# Patient Record
Sex: Male | Born: 1950 | Race: White | Hispanic: No | Marital: Married | Smoking: Former smoker
Health system: Southern US, Community
[De-identification: ages and names within clinical notes are randomized; demographics above are authoritative.]

## PROBLEM LIST (undated history)

## (undated) DIAGNOSIS — E7800 Pure hypercholesterolemia, unspecified: Secondary | ICD-10-CM

## (undated) DIAGNOSIS — M109 Gout, unspecified: Secondary | ICD-10-CM

## (undated) DIAGNOSIS — I10 Essential (primary) hypertension: Secondary | ICD-10-CM

## (undated) DIAGNOSIS — I2510 Atherosclerotic heart disease of native coronary artery without angina pectoris: Secondary | ICD-10-CM

## (undated) HISTORY — PX: CORONARY ARTERY BYPASS GRAFT: SHX141

## (undated) HISTORY — PX: CARDIAC SURGERY: SHX584

---

## 1998-02-18 ENCOUNTER — Ambulatory Visit (HOSPITAL_COMMUNITY): Admission: RE | Admit: 1998-02-18 | Discharge: 1998-02-18 | Payer: Self-pay | Admitting: Internal Medicine

## 1998-03-05 ENCOUNTER — Encounter: Payer: Self-pay | Admitting: Neurosurgery

## 1998-03-07 ENCOUNTER — Encounter: Payer: Self-pay | Admitting: Neurosurgery

## 1998-03-07 ENCOUNTER — Inpatient Hospital Stay (HOSPITAL_COMMUNITY): Admission: RE | Admit: 1998-03-07 | Discharge: 1998-03-08 | Payer: Self-pay | Admitting: Neurosurgery

## 2001-03-26 ENCOUNTER — Emergency Department (HOSPITAL_COMMUNITY): Admission: EM | Admit: 2001-03-26 | Discharge: 2001-03-26 | Payer: Self-pay | Admitting: Emergency Medicine

## 2001-10-20 ENCOUNTER — Emergency Department (HOSPITAL_COMMUNITY): Admission: EM | Admit: 2001-10-20 | Discharge: 2001-10-20 | Payer: Self-pay

## 2002-01-05 ENCOUNTER — Ambulatory Visit: Admission: RE | Admit: 2002-01-05 | Discharge: 2002-01-05 | Payer: Self-pay | Admitting: Internal Medicine

## 2002-09-13 ENCOUNTER — Ambulatory Visit (HOSPITAL_COMMUNITY): Admission: RE | Admit: 2002-09-13 | Discharge: 2002-09-13 | Payer: Self-pay | Admitting: Internal Medicine

## 2002-09-13 ENCOUNTER — Encounter: Payer: Self-pay | Admitting: Internal Medicine

## 2002-11-28 ENCOUNTER — Emergency Department (HOSPITAL_COMMUNITY): Admission: EM | Admit: 2002-11-28 | Discharge: 2002-11-28 | Payer: Self-pay | Admitting: Emergency Medicine

## 2003-01-25 ENCOUNTER — Ambulatory Visit (HOSPITAL_COMMUNITY): Admission: RE | Admit: 2003-01-25 | Discharge: 2003-01-25 | Payer: Self-pay | Admitting: Internal Medicine

## 2003-01-25 ENCOUNTER — Encounter: Payer: Self-pay | Admitting: Internal Medicine

## 2003-01-29 ENCOUNTER — Encounter: Payer: Self-pay | Admitting: Internal Medicine

## 2003-01-29 ENCOUNTER — Ambulatory Visit (HOSPITAL_COMMUNITY): Admission: RE | Admit: 2003-01-29 | Discharge: 2003-01-29 | Payer: Self-pay | Admitting: Internal Medicine

## 2004-01-01 ENCOUNTER — Encounter (HOSPITAL_COMMUNITY): Admission: RE | Admit: 2004-01-01 | Discharge: 2004-03-31 | Payer: Self-pay | Admitting: Internal Medicine

## 2004-06-29 ENCOUNTER — Ambulatory Visit: Payer: Self-pay | Admitting: Internal Medicine

## 2004-07-02 ENCOUNTER — Ambulatory Visit: Payer: Self-pay | Admitting: Internal Medicine

## 2004-07-14 ENCOUNTER — Ambulatory Visit: Payer: Self-pay | Admitting: Internal Medicine

## 2004-07-28 ENCOUNTER — Ambulatory Visit: Payer: Self-pay | Admitting: Internal Medicine

## 2004-07-28 ENCOUNTER — Ambulatory Visit (HOSPITAL_COMMUNITY): Admission: RE | Admit: 2004-07-28 | Discharge: 2004-07-28 | Payer: Self-pay | Admitting: Internal Medicine

## 2007-10-03 ENCOUNTER — Encounter: Admission: RE | Admit: 2007-10-03 | Discharge: 2007-10-03 | Payer: Self-pay | Admitting: Internal Medicine

## 2008-07-10 ENCOUNTER — Ambulatory Visit (HOSPITAL_COMMUNITY): Admission: RE | Admit: 2008-07-10 | Discharge: 2008-07-10 | Payer: Self-pay | Admitting: Internal Medicine

## 2008-07-15 ENCOUNTER — Encounter: Admission: RE | Admit: 2008-07-15 | Discharge: 2008-07-15 | Payer: Self-pay | Admitting: Internal Medicine

## 2009-06-23 ENCOUNTER — Encounter (INDEPENDENT_AMBULATORY_CARE_PROVIDER_SITE_OTHER): Payer: Self-pay | Admitting: *Deleted

## 2009-12-08 ENCOUNTER — Ambulatory Visit (HOSPITAL_COMMUNITY): Admission: RE | Admit: 2009-12-08 | Discharge: 2009-12-08 | Payer: Self-pay | Admitting: Internal Medicine

## 2010-01-09 ENCOUNTER — Telehealth: Payer: Self-pay | Admitting: Internal Medicine

## 2010-07-05 ENCOUNTER — Encounter: Payer: Self-pay | Admitting: Internal Medicine

## 2010-07-14 NOTE — Progress Notes (Signed)
Summary: Schedule Colonoscopy  Phone Note Outgoing Call Call back at Home Phone [phone]   Call placed by: Harlow Mares CMA Duncan Dull),  January 09, 2010 2:50 PM Call placed to: Patient Summary of Call: patient states that he is getting ready to have back surgery and he wanted the number to call back when he finds out if he surgery is going to be scheduled soon. I gave him the number and Dr. Broadus Dorsie Burich name.  Initial call taken by: Harlow Mares CMA Duncan Dull),  January 09, 2010 2:52 PM

## 2010-07-14 NOTE — Letter (Signed)
Summary: Colonoscopy Letter  [facility]  [address] [place], [place] [address]   Phone: [phone]  Fax: [phone]      June 23, 2009 MRN: [id number]   CHRISTINA WALDROP [address] [place], [place]  [address]   Dear Mr. Wirtanen,   According to your medical record, it is time for you to schedule a Colonoscopy. The American Cancer Society recommends this procedure as a method to detect early colon cancer. Patients with a family history of colon cancer, or a personal history of colon polyps or inflammatory bowel disease are at increased risk.  This letter has beeen generated based on the recommendations made at the time of your procedure. If you feel that in your particular situation this may no longer apply, please contact our office.  Please call our office at [phone] to schedule this appointment or to update your records at your earliest convenience.  Thank you for cooperating with [place] to provide you with the very best care possible.   Sincerely,  Wilhemina Bonito. Marina Goodell, M.D.  [facility] [phone]

## 2014-08-19 DIAGNOSIS — M792 Neuralgia and neuritis, unspecified: Secondary | ICD-10-CM | POA: Diagnosis not present

## 2014-08-31 DIAGNOSIS — I10 Essential (primary) hypertension: Secondary | ICD-10-CM | POA: Diagnosis not present

## 2014-08-31 DIAGNOSIS — M5417 Radiculopathy, lumbosacral region: Secondary | ICD-10-CM | POA: Diagnosis not present

## 2014-08-31 DIAGNOSIS — Z87891 Personal history of nicotine dependence: Secondary | ICD-10-CM | POA: Diagnosis not present

## 2015-06-04 ENCOUNTER — Encounter (HOSPITAL_COMMUNITY): Payer: Self-pay | Admitting: *Deleted

## 2015-06-04 ENCOUNTER — Emergency Department (HOSPITAL_COMMUNITY): Payer: Medicare Other

## 2015-06-04 ENCOUNTER — Inpatient Hospital Stay (HOSPITAL_COMMUNITY)
Admission: EM | Admit: 2015-06-04 | Discharge: 2015-06-07 | DRG: 872 | Disposition: A | Discharge status: Home / Self Care | Payer: Medicare Other | Attending: Internal Medicine | Admitting: Internal Medicine

## 2015-06-04 DIAGNOSIS — F1290 Cannabis use, unspecified, uncomplicated: Secondary | ICD-10-CM | POA: Diagnosis present

## 2015-06-04 DIAGNOSIS — A4151 Sepsis due to Escherichia coli [E. coli]: Secondary | ICD-10-CM | POA: Diagnosis not present

## 2015-06-04 DIAGNOSIS — E876 Hypokalemia: Secondary | ICD-10-CM | POA: Diagnosis present

## 2015-06-04 DIAGNOSIS — Z8249 Family history of ischemic heart disease and other diseases of the circulatory system: Secondary | ICD-10-CM

## 2015-06-04 DIAGNOSIS — I2510 Atherosclerotic heart disease of native coronary artery without angina pectoris: Secondary | ICD-10-CM | POA: Diagnosis not present

## 2015-06-04 DIAGNOSIS — Z87891 Personal history of nicotine dependence: Secondary | ICD-10-CM

## 2015-06-04 DIAGNOSIS — I10 Essential (primary) hypertension: Secondary | ICD-10-CM | POA: Diagnosis present

## 2015-06-04 DIAGNOSIS — N390 Urinary tract infection, site not specified: Secondary | ICD-10-CM | POA: Diagnosis present

## 2015-06-04 DIAGNOSIS — R079 Chest pain, unspecified: Secondary | ICD-10-CM

## 2015-06-04 DIAGNOSIS — D6959 Other secondary thrombocytopenia: Secondary | ICD-10-CM | POA: Diagnosis present

## 2015-06-04 DIAGNOSIS — E7800 Pure hypercholesterolemia, unspecified: Secondary | ICD-10-CM | POA: Diagnosis present

## 2015-06-04 DIAGNOSIS — R109 Unspecified abdominal pain: Secondary | ICD-10-CM

## 2015-06-04 DIAGNOSIS — E785 Hyperlipidemia, unspecified: Secondary | ICD-10-CM | POA: Diagnosis present

## 2015-06-04 DIAGNOSIS — Z951 Presence of aortocoronary bypass graft: Secondary | ICD-10-CM

## 2015-06-04 DIAGNOSIS — R1110 Vomiting, unspecified: Secondary | ICD-10-CM | POA: Diagnosis not present

## 2015-06-04 DIAGNOSIS — A419 Sepsis, unspecified organism: Secondary | ICD-10-CM | POA: Diagnosis present

## 2015-06-04 DIAGNOSIS — Z8049 Family history of malignant neoplasm of other genital organs: Secondary | ICD-10-CM

## 2015-06-04 DIAGNOSIS — R9431 Abnormal electrocardiogram [ECG] [EKG]: Secondary | ICD-10-CM | POA: Diagnosis not present

## 2015-06-04 DIAGNOSIS — N50812 Left testicular pain: Secondary | ICD-10-CM | POA: Insufficient documentation

## 2015-06-04 DIAGNOSIS — N419 Inflammatory disease of prostate, unspecified: Secondary | ICD-10-CM | POA: Diagnosis present

## 2015-06-04 DIAGNOSIS — R161 Splenomegaly, not elsewhere classified: Secondary | ICD-10-CM | POA: Diagnosis present

## 2015-06-04 DIAGNOSIS — N451 Epididymitis: Secondary | ICD-10-CM | POA: Diagnosis present

## 2015-06-04 DIAGNOSIS — R05 Cough: Secondary | ICD-10-CM | POA: Diagnosis present

## 2015-06-04 DIAGNOSIS — F329 Major depressive disorder, single episode, unspecified: Secondary | ICD-10-CM | POA: Diagnosis present

## 2015-06-04 HISTORY — DX: Pure hypercholesterolemia, unspecified: E78.00

## 2015-06-04 HISTORY — DX: Atherosclerotic heart disease of native coronary artery without angina pectoris: I25.10

## 2015-06-04 HISTORY — DX: Essential (primary) hypertension: I10

## 2015-06-04 HISTORY — DX: Gout, unspecified: M10.9

## 2015-06-04 LAB — URINALYSIS, ROUTINE W REFLEX MICROSCOPIC
Glucose, UA: NEGATIVE mg/dL
KETONES UR: 15 mg/dL — AB
NITRITE: POSITIVE — AB
Protein, ur: 30 mg/dL — AB
SPECIFIC GRAVITY, URINE: 1.02 (ref 1.005–1.030)
pH: 5.5 (ref 5.0–8.0)

## 2015-06-04 LAB — CBC
HCT: 38.8 % — ABNORMAL LOW (ref 39.0–52.0)
HEMATOCRIT: 41.2 % (ref 39.0–52.0)
Hemoglobin: 14.6 g/dL (ref 13.0–17.0)
Hemoglobin: 13.3 g/dL (ref 13.0–17.0)
MCH: 31.8 pg (ref 26.0–34.0)
MCH: 31.7 pg (ref 26.0–34.0)
MCHC: 35.4 g/dL (ref 30.0–36.0)
MCHC: 34.3 g/dL (ref 30.0–36.0)
MCV: 89.8 fL (ref 78.0–100.0)
MCV: 92.4 fL (ref 78.0–100.0)
PLATELETS: 98 10*3/uL — AB (ref 150–400)
Platelets: 109 10*3/uL — ABNORMAL LOW (ref 150–400)
RBC: 4.59 MIL/uL (ref 4.22–5.81)
RBC: 4.2 MIL/uL — AB (ref 4.22–5.81)
RDW: 12.9 % (ref 11.5–15.5)
RDW: 13 % (ref 11.5–15.5)
WBC: 14.5 10*3/uL — ABNORMAL HIGH (ref 4.0–10.5)
WBC: 11.3 10*3/uL — AB (ref 4.0–10.5)

## 2015-06-04 LAB — URINE MICROSCOPIC-ADD ON

## 2015-06-04 LAB — BASIC METABOLIC PANEL
Anion gap: 12 (ref 5–15)
BUN: 11 mg/dL (ref 6–20)
CHLORIDE: 100 mmol/L — AB (ref 101–111)
CO2: 23 mmol/L (ref 22–32)
Calcium: 9 mg/dL (ref 8.9–10.3)
Creatinine, Ser: 1.03 mg/dL (ref 0.61–1.24)
GFR calc Af Amer: >60 mL/min (ref >60)
GFR calc non Af Amer: >60 mL/min (ref >60)
GLUCOSE: 128 mg/dL — AB (ref 65–99)
POTASSIUM: 3.5 mmol/L (ref 3.5–5.1)
Sodium: 135 mmol/L (ref 135–145)

## 2015-06-04 LAB — I-STAT TROPONIN, ED: Troponin i, poc: 0.02 ng/mL (ref 0.00–0.08)

## 2015-06-04 LAB — I-STAT CG4 LACTIC ACID, ED: Lactic Acid, Venous: 1.04 mmol/L (ref 0.5–2.0)

## 2015-06-04 MED ORDER — PANTOPRAZOLE SODIUM 40 MG PO TBEC
40.0000 mg | DELAYED_RELEASE_TABLET | Freq: Every day | ORAL | Status: DC
  Administered 2015-06-05 – 2015-06-07 (×3): 40 mg via ORAL
  Filled 2015-06-04 (×3): qty 1

## 2015-06-04 MED ORDER — ENOXAPARIN SODIUM 40 MG/0.4ML ~~LOC~~ SOLN
40.0000 mg | SUBCUTANEOUS | Status: DC
  Administered 2015-06-05 – 2015-06-07 (×3): 40 mg via SUBCUTANEOUS
  Filled 2015-06-04 (×3): qty 0.4

## 2015-06-04 MED ORDER — SODIUM CHLORIDE 0.9 % IV SOLN
INTRAVENOUS | Status: AC
  Administered 2015-06-04: 23:00:00 via INTRAVENOUS

## 2015-06-04 MED ORDER — ONDANSETRON HCL 4 MG/2ML IJ SOLN
4.0000 mg | Freq: Four times a day (QID) | INTRAMUSCULAR | Status: DC | PRN
Start: 2015-06-04 — End: 2015-06-07
  Administered 2015-06-06: 4 mg via INTRAVENOUS
  Filled 2015-06-04: qty 2

## 2015-06-04 MED ORDER — SERTRALINE HCL 50 MG PO TABS
150.0000 mg | ORAL_TABLET | Freq: Every day | ORAL | Status: DC
  Administered 2015-06-05 – 2015-06-07 (×3): 150 mg via ORAL
  Filled 2015-06-04 (×3): qty 1

## 2015-06-04 MED ORDER — HYDROCODONE-ACETAMINOPHEN 5-325 MG PO TABS
1.0000 | ORAL_TABLET | ORAL | Status: DC | PRN
  Administered 2015-06-04 – 2015-06-06 (×6): 2 via ORAL
  Filled 2015-06-04 (×6): qty 2

## 2015-06-04 MED ORDER — ATORVASTATIN CALCIUM 10 MG PO TABS
10.0000 mg | ORAL_TABLET | Freq: Every day | ORAL | Status: DC
  Administered 2015-06-05 – 2015-06-07 (×3): 10 mg via ORAL
  Filled 2015-06-04 (×3): qty 1

## 2015-06-04 MED ORDER — ACETAMINOPHEN 325 MG PO TABS
650.0000 mg | ORAL_TABLET | Freq: Once | ORAL | Status: AC
  Administered 2015-06-04: 650 mg via ORAL
  Filled 2015-06-04: qty 2

## 2015-06-04 MED ORDER — DEXTROSE 5 % IV SOLN
1.0000 g | INTRAVENOUS | Status: DC
  Administered 2015-06-04 – 2015-06-05 (×2): 1 g via INTRAVENOUS
  Filled 2015-06-04 (×3): qty 10

## 2015-06-04 MED ORDER — ALLOPURINOL 300 MG PO TABS
150.0000 mg | ORAL_TABLET | Freq: Every day | ORAL | Status: DC
  Administered 2015-06-05 – 2015-06-07 (×3): 150 mg via ORAL
  Filled 2015-06-04 (×3): qty 1

## 2015-06-04 MED ORDER — SODIUM CHLORIDE 0.9 % IJ SOLN
3.0000 mL | Freq: Two times a day (BID) | INTRAMUSCULAR | Status: DC
  Administered 2015-06-04 – 2015-06-07 (×5): 3 mL via INTRAVENOUS

## 2015-06-04 MED ORDER — ACETAMINOPHEN 325 MG PO TABS
650.0000 mg | ORAL_TABLET | Freq: Four times a day (QID) | ORAL | Status: DC | PRN
  Administered 2015-06-05 – 2015-06-06 (×2): 650 mg via ORAL
  Filled 2015-06-04 (×3): qty 2

## 2015-06-04 MED ORDER — SODIUM CHLORIDE 0.9 % IV BOLUS (SEPSIS)
1000.0000 mL | INTRAVENOUS | Status: AC
  Administered 2015-06-04 – 2015-06-05 (×3): 1000 mL via INTRAVENOUS

## 2015-06-04 MED ORDER — ASPIRIN EC 81 MG PO TBEC
81.0000 mg | DELAYED_RELEASE_TABLET | Freq: Every day | ORAL | Status: DC
  Administered 2015-06-05 – 2015-06-07 (×3): 81 mg via ORAL
  Filled 2015-06-04 (×3): qty 1

## 2015-06-04 MED ORDER — ACETAMINOPHEN 650 MG RE SUPP
650.0000 mg | Freq: Four times a day (QID) | RECTAL | Status: DC | PRN
Start: 2015-06-04 — End: 2015-06-07

## 2015-06-04 MED ORDER — GUAIFENESIN ER 600 MG PO TB12
600.0000 mg | ORAL_TABLET | Freq: Two times a day (BID) | ORAL | Status: DC
  Administered 2015-06-04 – 2015-06-07 (×6): 600 mg via ORAL
  Filled 2015-06-04 (×6): qty 1

## 2015-06-04 MED ORDER — ALBUTEROL SULFATE (2.5 MG/3ML) 0.083% IN NEBU: 2.5000 mg | INHALATION_SOLUTION | RESPIRATORY_TRACT | Status: DC | PRN

## 2015-06-04 MED ORDER — GABAPENTIN 300 MG PO CAPS
300.0000 mg | ORAL_CAPSULE | Freq: Three times a day (TID) | ORAL | Status: DC
  Administered 2015-06-04 – 2015-06-07 (×8): 300 mg via ORAL
  Filled 2015-06-04 (×8): qty 1

## 2015-06-04 MED ORDER — METOPROLOL TARTRATE 12.5 MG HALF TABLET
12.5000 mg | ORAL_TABLET | Freq: Two times a day (BID) | ORAL | Status: DC
  Administered 2015-06-05 – 2015-06-07 (×5): 12.5 mg via ORAL
  Filled 2015-06-04 (×6): qty 1

## 2015-06-04 MED ORDER — ONDANSETRON HCL 4 MG PO TABS: 4.0000 mg | ORAL_TABLET | Freq: Four times a day (QID) | ORAL | Status: DC | PRN

## 2015-06-04 NOTE — ED Notes (Addendum)
Pt reports right side chest pains for several days. Has chronic sob, reports dry cough x 1 month. Denies swelling to extremities but is having testicle pain and swelling.also reports fever, back pain, hallucinations, fatigue and vomiting.

## 2015-06-04 NOTE — ED Notes (Signed)
Pt transported to xray 

## 2015-06-04 NOTE — H&P (Signed)
PCP: [facility] at [facility] in [place] [facility] Marina Goodell Referring provider Freida Busman   Chief Complaint:  Right side chest pain and dry cough, testicular pain and swelling fever and back pain  HPI: Gregory Juarez is a 64 y.o. male   has a past medical history of Hypertension; High cholesterol; and Coronary artery disease.   Presented with some cough for more than 1 month and congestion He is reporting some green sputum production.. Recently 2 days ago  He developed fever at home as high as 102. Reports some dysuria and suprapubic pressure for the past few days. Reports Vomiting but no diarrhea. He reports two episodes of stubbing right chest pain now has resolved. 2 weeks ago he had an episode of syncope when he became significantly hypertensive. Patient is sp CABG in [facility] He has been a bit a bit confused over the past 1 month forgetting things and reports hallucinations. Seeing his grandson today when he was not there. At that time he had fever.  He sometimes sees things out the corner of his eyes that has been there for years.  UA was worrisome for UTI per ER provider perineal exam showed tenderness worrisome for prostatitis Chest x-ray showed no evidence of infiltrate. Patient was started on Rocephin Hospitalist was called for admission for UTI with possible prostatitis  Review of Systems:    Pertinent positives include:  Fevers, chills, fatigue, nausea, vomiting, dysuria, change  color of urine, hesitance straining to urinate.  Constitutional:  No weight loss, night sweats, weight loss  HEENT:  No headaches, Difficulty swallowing,Tooth/dental problems,Sore throat,  No sneezing, itching, ear ache, nasal congestion, post nasal drip,  Cardio-vascular:  No chest pain, Orthopnea, PND, anasarca, dizziness, palpitations.no Bilateral lower extremity swelling  GI:  No heartburn, indigestion, abdominal pain, diarrhea, change in bowel habits, loss of appetite, melena, blood in stool,  hematemesis Resp:  no shortness of breath at rest. No dyspnea on exertion, No excess mucus, no productive cough, No non-productive cough, No coughing up of blood.No change in color of mucus.No wheezing. Skin:  no rash or lesions. No jaundice GU:    no urgency or frequency  No flank pain.  Musculoskeletal:  No joint pain or no joint swelling. No decreased range of motion. No back pain.  Psych:  No change in mood or affect. No depression or anxiety. No memory loss.  Neuro: no localizing neurological complaints, no tingling, no weakness, no double vision, no gait abnormality, no slurred speech, no confusion  Otherwise ROS are negative except for above, 10 systems were reviewed  Past Medical History: Past Medical History  Diagnosis Date  . Hypertension   . High cholesterol   . Coronary artery disease    Past Surgical History  Procedure Laterality Date  . Cardiac surgery       Medications: Prior to Admission medications   Medication Sig Start Date End Date Taking? Authorizing Provider  allopurinol (ZYLOPRIM) 300 MG tablet Take 150 mg by mouth daily.   Yes Historical Provider, MD  amLODipine (NORVASC) 10 MG tablet Take 10 mg by mouth daily.   Yes Historical Provider, MD  aspirin 81 MG tablet Take 81 mg by mouth daily.   Yes Historical Provider, MD  atorvastatin (LIPITOR) 20 MG tablet Take 10 mg by mouth daily.   Yes Historical Provider, MD  gabapentin (NEURONTIN) 300 MG capsule Take 300 mg by mouth 3 (three) times daily.   Yes Historical Provider, MD  hydrochlorothiazide (HYDRODIURIL) 25 MG tablet Take 12.5  mg by mouth daily.   Yes Historical Provider, MD  metoprolol (LOPRESSOR) 50 MG tablet Take 25 mg by mouth daily.   Yes Historical Provider, MD  minoxidil (LONITEN) 10 MG tablet Take 10 mg by mouth daily.   Yes Historical Provider, MD  omeprazole (PRILOSEC) 20 MG capsule Take 20 mg by mouth daily.   Yes Historical Provider, MD  PRESCRIPTION MEDICATION Place 1 drop into both eyes  4 (four) times daily. Medication: Refresh Classic eye drops   Yes Historical Provider, MD  sertraline (ZOLOFT) 100 MG tablet Take 150 mg by mouth daily. Splits the  tablet in half to make total of    Yes Historical Provider, MD    Allergies:  No Known Allergies  Social History:  Ambulatory independently  Lives at home   With family     reports that he has quit smoking. He does not have any smokeless tobacco history on file. He reports that he uses illicit drugs (Marijuana). He reports that he does not drink alcohol.    Family History: family history includes CAD in his sister; Uterine cancer in his mother.    Physical Exam: Patient Vitals for the past 24 hrs:  BP Temp Temp src Pulse Resp SpO2  06/04/15 1853 - 100.1 F (37.8 C) Oral - - -  06/04/15 1845 114/59 mmHg - - 85 21 94 %  06/04/15 1815 119/61 mmHg - - 94 17 97 %  06/04/15 1745 138/71 mmHg - - 93 20 97 %  06/04/15 1706 109/70 mmHg 100.6 F (38.1 C) Oral 97 18 98 %    1. General:  in No Acute distress 2. Psychological: Alert and  Oriented 3. Head/ENT:   Moist  Mucous Membranes                          Head Non traumatic, neck supple                          Norma  Dentition 4. SKIN decreased Skin turgor,  Skin clean Dry and intact no rash 5. Heart: Regular rate and rhythm no Murmur, Rub or gallop 6. Lungs: Clear to auscultation bilaterally, no wheezes or crackles   7. Abdomen: Soft, non-tender, Non distended 8. Lower extremities: no clubbing, cyanosis, or edema 9. Neurologically Grossly intact, moving all 4 extremities equally 10. MSK: Normal range of motion  body mass index is unknown because there is no height or weight on file.   Labs on Admission:   Results for orders placed or performed during the hospital encounter of 06/04/15 (from the past 24 hour(s))  I-stat troponin, ED (not at [facility])     Status: None   Collection Time: 06/04/15  5:22 PM  Result Value Ref Range   Troponin i, poc 0.02  0.00 - 0.08 ng/mL   Comment 3          Basic metabolic panel     Status: Abnormal   Collection Time: 06/04/15  5:27 PM  Result Value Ref Range   Sodium 135 135 - 145 mmol/L   Potassium 3.5 3.5 - 5.1 mmol/L   Chloride 100 (L) 101 - 111 mmol/L   CO2 23 22 - 32 mmol/L   Glucose, Bld 128 (H) 65 - 99 mg/dL   BUN 11 6 - 20 mg/dL   Creatinine, Ser 1.61 0.61 - 1.24 mg/dL   Calcium 9.0 8.9 - 09.6 mg/dL  GFR calc non Af Amer >60 >60 mL/min   GFR calc Af Amer >60 >60 mL/min   Anion gap 12 5 - 15  CBC     Status: Abnormal   Collection Time: 06/04/15  5:27 PM  Result Value Ref Range   WBC 14.5 (H) 4.0 - 10.5 K/uL   RBC 4.59 4.22 - 5.81 MIL/uL   Hemoglobin 14.6 13.0 - 17.0 g/dL   HCT 16.141.2 09.639.0 - 04.552.0 %   MCV 89.8 78.0 - 100.0 fL   MCH 31.8 26.0 - 34.0 pg   MCHC 35.4 30.0 - 36.0 g/dL   RDW 40.912.9 81.111.5 - 91.415.5 %   Platelets 109 (L) 150 - 400 K/uL  Urinalysis, Routine w reflex microscopic (not at [facility])     Status: Abnormal   Collection Time: 06/04/15  5:56 PM  Result Value Ref Range   Color, Urine AMBER (A) YELLOW   APPearance CLOUDY (A) CLEAR   Specific Gravity, Urine 1.020 1.005 - 1.030   pH 5.5 5.0 - 8.0   Glucose, UA NEGATIVE NEGATIVE mg/dL   Hgb urine dipstick SMALL (A) NEGATIVE   Bilirubin Urine SMALL (A) NEGATIVE   Ketones, ur 15 (A) NEGATIVE mg/dL   Protein, ur 30 (A) NEGATIVE mg/dL   Nitrite POSITIVE (A) NEGATIVE   Leukocytes, UA MODERATE (A) NEGATIVE  Urine microscopic-add on     Status: Abnormal   Collection Time: 06/04/15  5:56 PM  Result Value Ref Range   Squamous Epithelial / LPF 0-5 (A) NONE SEEN   WBC, UA TOO NUMEROUS TO COUNT 0 - 5 WBC/hpf   RBC / HPF 0-5 0 - 5 RBC/hpf   Bacteria, UA MANY (A) NONE SEEN   Urine-Other MUCOUS PRESENT   I-Stat CG4 Lactic Acid, ED     Status: None   Collection Time: 06/04/15  6:17 PM  Result Value Ref Range   Lactic Acid, Venous 1.04 0.5 - 2.0 mmol/L    UA evidence of UTi  No results found for: HGBA1C  CrCl cannot be calculated  (Unknown ideal weight.).  BNP (last 3 results) No results for input(s): PROBNP in the last 8760 hours.  Other results:  I have pearsonaly reviewed this: ECG REPORT  Rate: 97  Rhythm: Sinus rhythm with PACs ST&T Change: Nonspecific ST segment changes QTC within normal limits  There were no vitals filed for this visit.   Cultures: No results found for: SDES, SPECREQUEST, CULT, REPTSTATUS   Radiological Exams on Admission: Dg Chest 2 View  06/04/2015  CLINICAL DATA:  Left testicular swelling. Vomiting. Hallucinations. Chronic back pain. EXAM: CHEST  2 VIEW COMPARISON:  None. FINDINGS: There is no focal parenchymal opacity. There is no pleural effusion or pneumothorax. The heart and mediastinal contours are unremarkable. There is evidence of prior median sternotomy. The osseous structures are unremarkable. IMPRESSION: No active cardiopulmonary disease. Electronically Signed   By: Elige KoHetal  Patel   On: 06/04/2015 17:43    Chart has been reviewed  Family  at  Bedside  plan of care was discussed with   Daughter Sandrea HammondMylesha Blalock [phone]  Assessment/Plan  64 yo M with hx of CAD here with urinary complaints and evidence of UTI but possible prostatitis history of coughing negative chest x-ray most likely bronchitis  Present on Admission:  . Sepsis (HCC) solid evidence of end organ damage. We will rehydrate continue IV antibiotics await results of urine and blood cultures  . UTI (lower urinary tract infection) continue Rocephin await results of urine culture  .  Prostatitis treated with Rocephin  . Chest pain atypical but given history of coronary artery disease with cervical cardiac enzymes monitored telemetry  . CAD (coronary artery disease) continue aspirin and statin and beta blocker holding parameters  . Hypertension is soft blood pressures will hold off on Norvasc decrease dose of metoprolol with holding parameters, hold hydrochlorothiazide Persistent cough with past history of  smoking suspect bronchitis. Will go albuterol as needed and start Mucinex. Occasional confusion - hallucinations on associated high fever. Currently resolved. We will need to have this worked up as an outpatient. Will check B-12 level RPR and TSH  Prophylaxis:   Lovenox   CODE STATUS:  FULL CODE  as per patient   Disposition: To home once workup is complete and patient is stable  Other plan as per orders.  I have spent a total of  55 min on this admission  Brittanni Cariker 06/04/2015, 8:02 PM  [facility]  Pager [phone]   after 2 AM please page floor coverage PA If 7AM-7PM, please contact the day team taking care of the patient  [email or web address]  Password [email or web address]

## 2015-06-04 NOTE — ED Notes (Signed)
Report given.

## 2015-06-04 NOTE — ED Provider Notes (Signed)
CSN: [id number]     Arrival date & time 06/04/15  1659 History   First MD Initiated Contact with Patient 06/04/15 1726     Chief Complaint  Patient presents with  . Chest Pain  . Testicle Pain     (Consider location/radiation/quality/duration/timing/severity/associated sxs/prior Treatment) HPI Comments: Patient here complaining of one week of cough and congestion with associated temperature of 102 at home 2 days. Cough has been productive of green sputum. He also notes some suprapubic pressure as well as some dysuria. No reported flank pain. Did have emesis yesterday but none today. Denies any diarrhea. Denies any neck pain or photophobia. No recent rashes. Denies any chest discomfort. No testicular swelling. Has had some intermittent hallucinations. Symptoms persistent and no treatment use prior to arrival and nothing makes them better worse  Patient is a 64 y.o. male presenting with chest pain and testicular pain. The history is provided by the patient and a relative.  Chest Pain Testicle Pain Associated symptoms include chest pain.    Past Medical History  Diagnosis Date  . Hypertension   . High cholesterol   . Coronary artery disease    Past Surgical History  Procedure Laterality Date  . Cardiac surgery     History reviewed. No pertinent family history. Social History  Substance Use Topics  . Smoking status: Never Smoker   . Smokeless tobacco: None  . Alcohol Use: No    Review of Systems  Cardiovascular: Positive for chest pain.  Genitourinary: Positive for testicular pain.  All other systems reviewed and are negative.     Allergies  Review of patient's allergies indicates no known allergies.  Home Medications   Prior to Admission medications   Not on File   BP 109/70 mmHg  Pulse 97  Temp(Src) 100.6 F (38.1 C) (Oral)  Resp 18  SpO2 98% Physical Exam  Constitutional: He is oriented to person, place, and time. He appears well-developed and  well-nourished.  Non-toxic appearance. No distress.  HENT:  Head: Normocephalic and atraumatic.  Eyes: Conjunctivae, EOM and lids are normal. Pupils are equal, round, and reactive to light.  Neck: Normal range of motion. Neck supple. No tracheal deviation present. No thyroid mass present.  Cardiovascular: Normal rate, regular rhythm and normal heart sounds.  Exam reveals no gallop.   No murmur heard. Pulmonary/Chest: Effort normal and breath sounds normal. No stridor. No respiratory distress. He has no decreased breath sounds. He has no wheezes. He has no rhonchi. He has no rales.  Abdominal: Soft. Normal appearance and bowel sounds are normal. He exhibits no distension. There is no tenderness. There is no rebound and no CVA tenderness.  Musculoskeletal: Normal range of motion. He exhibits no edema or tenderness.  Neurological: He is alert and oriented to person, place, and time. He has normal strength. No cranial nerve deficit or sensory deficit. GCS eye subscore is 4. GCS verbal subscore is 5. GCS motor subscore is 6.  Skin: Skin is warm and dry. No abrasion and no rash noted.  Psychiatric: He has a normal mood and affect. His speech is normal and behavior is normal.  Nursing note and vitals reviewed.   ED Course  Procedures (including critical care time) Labs Review Labs Reviewed  CULTURE, BLOOD (ROUTINE X 2)  CULTURE, BLOOD (ROUTINE X 2)  URINE CULTURE  BASIC METABOLIC PANEL  CBC  URINALYSIS, ROUTINE W REFLEX MICROSCOPIC (NOT AT [facility])  Rosezena SensorI-STAT TROPOININ, ED  I-STAT CG4 LACTIC ACID, ED    Imaging  Review Dg Chest 2 View  06/04/2015  CLINICAL DATA:  Left testicular swelling. Vomiting. Hallucinations. Chronic back pain. EXAM: CHEST  2 VIEW COMPARISON:  None. FINDINGS: There is no focal parenchymal opacity. There is no pleural effusion or pneumothorax. The heart and mediastinal contours are unremarkable. There is evidence of prior median sternotomy. The osseous structures are  unremarkable. IMPRESSION: No active cardiopulmonary disease. Electronically Signed   By: Elige Ko   On: 06/04/2015 17:43   I have personally reviewed and evaluated these images and lab results as part of my medical decision-making.   EKG Interpretation   Date/Time:  Wednesday June 04 2015 17:00:31 EST Ventricular Rate:  97 PR Interval:  164 QRS Duration: 86 QT Interval:  336 QTC Calculation: 426 R Axis:   85 Text Interpretation:  Sinus rhythm with Premature atrial complexes  Nonspecific ST abnormality Abnormal ECG No significant change since last  tracing Confirmed by Aiya Keach  MD, Tonique Mendonca ([id number]) on 06/04/2015 5:50:54 PM      MDM   Final diagnoses:  None   Patient tended to palpation in his perineum and suspect he likely has prostatitis. He is feeling weak at this time and has been started on Rocephin and given IV fluids. Will be admitted to the medicine service     Lorre Nick, MD 06/04/15 [phone]

## 2015-06-05 ENCOUNTER — Inpatient Hospital Stay (HOSPITAL_COMMUNITY): Payer: Medicare Other

## 2015-06-05 DIAGNOSIS — F329 Major depressive disorder, single episode, unspecified: Secondary | ICD-10-CM | POA: Diagnosis present

## 2015-06-05 DIAGNOSIS — Z8049 Family history of malignant neoplasm of other genital organs: Secondary | ICD-10-CM | POA: Diagnosis not present

## 2015-06-05 DIAGNOSIS — R05 Cough: Secondary | ICD-10-CM | POA: Diagnosis present

## 2015-06-05 DIAGNOSIS — E7800 Pure hypercholesterolemia, unspecified: Secondary | ICD-10-CM | POA: Diagnosis present

## 2015-06-05 DIAGNOSIS — N50812 Left testicular pain: Secondary | ICD-10-CM | POA: Diagnosis not present

## 2015-06-05 DIAGNOSIS — R072 Precordial pain: Secondary | ICD-10-CM

## 2015-06-05 DIAGNOSIS — E785 Hyperlipidemia, unspecified: Secondary | ICD-10-CM | POA: Diagnosis present

## 2015-06-05 DIAGNOSIS — F1290 Cannabis use, unspecified, uncomplicated: Secondary | ICD-10-CM | POA: Diagnosis present

## 2015-06-05 DIAGNOSIS — N419 Inflammatory disease of prostate, unspecified: Secondary | ICD-10-CM | POA: Diagnosis not present

## 2015-06-05 DIAGNOSIS — R109 Unspecified abdominal pain: Secondary | ICD-10-CM | POA: Diagnosis not present

## 2015-06-05 DIAGNOSIS — D6959 Other secondary thrombocytopenia: Secondary | ICD-10-CM | POA: Diagnosis not present

## 2015-06-05 DIAGNOSIS — E876 Hypokalemia: Secondary | ICD-10-CM | POA: Diagnosis present

## 2015-06-05 DIAGNOSIS — A4151 Sepsis due to Escherichia coli [E. coli]: Secondary | ICD-10-CM | POA: Diagnosis not present

## 2015-06-05 DIAGNOSIS — N390 Urinary tract infection, site not specified: Secondary | ICD-10-CM | POA: Diagnosis not present

## 2015-06-05 DIAGNOSIS — Z87891 Personal history of nicotine dependence: Secondary | ICD-10-CM | POA: Diagnosis not present

## 2015-06-05 DIAGNOSIS — I2510 Atherosclerotic heart disease of native coronary artery without angina pectoris: Secondary | ICD-10-CM | POA: Diagnosis not present

## 2015-06-05 DIAGNOSIS — N410 Acute prostatitis: Secondary | ICD-10-CM | POA: Diagnosis not present

## 2015-06-05 DIAGNOSIS — A419 Sepsis, unspecified organism: Secondary | ICD-10-CM | POA: Diagnosis not present

## 2015-06-05 DIAGNOSIS — Z951 Presence of aortocoronary bypass graft: Secondary | ICD-10-CM | POA: Diagnosis not present

## 2015-06-05 DIAGNOSIS — I10 Essential (primary) hypertension: Secondary | ICD-10-CM | POA: Diagnosis not present

## 2015-06-05 DIAGNOSIS — R161 Splenomegaly, not elsewhere classified: Secondary | ICD-10-CM | POA: Diagnosis present

## 2015-06-05 DIAGNOSIS — N451 Epididymitis: Secondary | ICD-10-CM | POA: Diagnosis not present

## 2015-06-05 DIAGNOSIS — Z8249 Family history of ischemic heart disease and other diseases of the circulatory system: Secondary | ICD-10-CM | POA: Diagnosis not present

## 2015-06-05 LAB — COMPREHENSIVE METABOLIC PANEL
ALK PHOS: 94 U/L (ref 38–126)
ALT: 17 U/L (ref 17–63)
ANION GAP: 8 (ref 5–15)
AST: 19 U/L (ref 15–41)
Albumin: 2.9 g/dL — ABNORMAL LOW (ref 3.5–5.0)
BUN: 10 mg/dL (ref 6–20)
CALCIUM: 7.7 mg/dL — AB (ref 8.9–10.3)
CO2: 25 mmol/L (ref 22–32)
Chloride: 104 mmol/L (ref 101–111)
Creatinine, Ser: 0.85 mg/dL (ref 0.61–1.24)
GFR calc non Af Amer: >60 mL/min (ref >60)
Glucose, Bld: 132 mg/dL — ABNORMAL HIGH (ref 65–99)
Potassium: 3 mmol/L — ABNORMAL LOW (ref 3.5–5.1)
SODIUM: 137 mmol/L (ref 135–145)
TOTAL PROTEIN: 5.3 g/dL — AB (ref 6.5–8.1)
Total Bilirubin: 1.5 mg/dL — ABNORMAL HIGH (ref 0.3–1.2)

## 2015-06-05 LAB — CBC WITH DIFFERENTIAL/PLATELET
Basophils Absolute: 0 10*3/uL (ref 0.0–0.1)
Basophils Relative: 0 %
Eosinophils Absolute: 0 10*3/uL (ref 0.0–0.7)
Eosinophils Relative: 0 %
HEMATOCRIT: 33.8 % — AB (ref 39.0–52.0)
HEMOGLOBIN: 11.6 g/dL — AB (ref 13.0–17.0)
LYMPHS ABS: 0.6 10*3/uL — AB (ref 0.7–4.0)
LYMPHS PCT: 7 %
MCH: 31.4 pg (ref 26.0–34.0)
MCHC: 34.3 g/dL (ref 30.0–36.0)
MCV: 91.4 fL (ref 78.0–100.0)
Monocytes Absolute: 0.5 10*3/uL (ref 0.1–1.0)
Monocytes Relative: 6 %
NEUTROS PCT: 87 %
Neutro Abs: 7.2 10*3/uL (ref 1.7–7.7)
Platelets: 72 10*3/uL — ABNORMAL LOW (ref 150–400)
RBC: 3.7 MIL/uL — AB (ref 4.22–5.81)
RDW: 13 % (ref 11.5–15.5)
WBC: 8.3 10*3/uL (ref 4.0–10.5)

## 2015-06-05 LAB — MAGNESIUM: MAGNESIUM: 1.6 mg/dL — AB (ref 1.7–2.4)

## 2015-06-05 LAB — CREATININE, SERUM: Creatinine, Ser: 1.12 mg/dL (ref 0.61–1.24)

## 2015-06-05 LAB — TROPONIN I
Troponin I: 0.04 ng/mL — ABNORMAL HIGH (ref <0.031)
Troponin I: 0.04 ng/mL — ABNORMAL HIGH (ref <0.031)

## 2015-06-05 LAB — PROCALCITONIN: Procalcitonin: 0.28 ng/mL

## 2015-06-05 LAB — HIV ANTIBODY (ROUTINE TESTING W REFLEX): HIV Screen 4th Generation wRfx: NONREACTIVE

## 2015-06-05 LAB — TSH: TSH: 3.104 u[IU]/mL (ref 0.350–4.500)

## 2015-06-05 LAB — PHOSPHORUS: PHOSPHORUS: 2.2 mg/dL — AB (ref 2.5–4.6)

## 2015-06-05 LAB — RPR: RPR Ser Ql: NONREACTIVE

## 2015-06-05 MED ORDER — SODIUM CHLORIDE 0.9 % IV SOLN
INTRAVENOUS | Status: DC
  Administered 2015-06-05: 16:00:00 via INTRAVENOUS
  Filled 2015-06-05 (×2): qty 1000

## 2015-06-05 MED ORDER — POTASSIUM CHLORIDE IN NACL 20-0.9 MEQ/L-% IV SOLN
INTRAVENOUS | Status: DC
  Administered 2015-06-06: via INTRAVENOUS
  Filled 2015-06-05: qty 1000

## 2015-06-05 MED ORDER — POTASSIUM CHLORIDE CRYS ER 20 MEQ PO TBCR
40.0000 meq | EXTENDED_RELEASE_TABLET | Freq: Once | ORAL | Status: AC
  Administered 2015-06-05: 40 meq via ORAL
  Filled 2015-06-05: qty 2

## 2015-06-05 MED ORDER — ALUM & MAG HYDROXIDE-SIMETH 200-200-20 MG/5ML PO SUSP
30.0000 mL | Freq: Four times a day (QID) | ORAL | Status: DC | PRN
  Filled 2015-06-05: qty 30

## 2015-06-05 NOTE — Progress Notes (Addendum)
PROGRESS NOTE  Gregory Juarez QMV:[id number] DOB: 09/16/1950 DOA: 06/04/2015 PCP: PROVIDER NOT IN SYSTEM Brief History 64 year old male with a history of coronary artery disease status post CABG, hypertension, hyperlipidemia, depression presented with 2 day history of fever up to 102.75F with associated dysuria and left lower quadrant abdominal pain and left groin pain. The patient also has had left testicular swelling and pain for 2 days prior to admission with dysuria and hematuria. He denies any recent genitourinary manipulation or surgery. Patient had nausea and vomiting 2 days prior to admission, but this has improved. He denies any headache, chest pain, hemoptysis, hematochezia, melena, rashes, synovitis. He has been complaining of pleuritic right-sided chest pain for the past 2 years. He has had dyspnea on exertion after walking about 50 yards which has not changed. He denies any worsening lower extremity edema, increasing abdominal girth, or PND type symptoms. He has had a chronic cough for which she has had an extensive workup including chest x-rays and CTs of the chest which have been unremarkable. The patient takes a PPI at home without any change in his coughing.  Assessment/Plan: Sepsis -Suspect genitourinary source -UA shows TNTC WBC -Concern about orchitis/epididymitis -Continue intravenous fluids -Continue intravenous antibiotics pending culture data -Lactic acid 1.04 -Procalcitonin 0.28 Left lower quadrant abdominal pain/left testicular pain -Scrotal ultrasound -CT abdomen pelvis -Urine NAA for GC/Chlamydia Atypical chest pain -EKG without any concerning ischemic changes -Minimally elevated troponins likely represents demand ischemia -Continue aspirin Hypertension -Hold amlodipine, HCTZ -Continue low-dose metoprolol tartrate -Patient no longer takes minoxidil Coronary artery disease status post CABG -Continue aspirin and Lipitor -Continue metoprolol  tartrate Hypokalemia -Replete -Check magnesium  Family Communication:   Daughter updated at beside 12/22 Disposition Plan:   Home 1-2 days       Procedures/Studies: Dg Chest 2 View  06/04/2015  CLINICAL DATA:  Left testicular swelling. Vomiting. Hallucinations. Chronic back pain. EXAM: CHEST  2 VIEW COMPARISON:  None. FINDINGS: There is no focal parenchymal opacity. There is no pleural effusion or pneumothorax. The heart and mediastinal contours are unremarkable. There is evidence of prior median sternotomy. The osseous structures are unremarkable. IMPRESSION: No active cardiopulmonary disease. Electronically Signed   By: Elige KoHetal  Patel   On: 06/04/2015 17:43        Subjective: Patient complains of left lower quadrant and left inguinal pain as well as left scrotal pain. It is somewhat better than yesterday. Denies any fevers, chills, chest pain, hemoptysis, nausea, vomiting, diarrhea, hematochezia, melena  Objective: Filed Vitals:   06/05/15 0545 06/05/15 0654 06/05/15 0943 06/05/15 1058  BP: 103/58  115/67 109/60  Pulse: 79  90 80  Temp: 98.4 F (36.9 C)   98.5 F (36.9 C)  TempSrc: Oral   Oral  Resp: 18   18  Height:      Weight:  116 kg (255 lb 11.7 oz)    SpO2: 94%   97%    Intake/Output Summary (Last 24 hours) at 06/05/15 1442 Last data filed at 06/05/15 1058  Gross per 24 hour  Intake 1082.5 ml  Output   1125 ml  Net  -42.5 ml   Weight change:  Exam:   General:  Pt is alert, follows commands appropriately, not in acute distress  HEENT: No icterus, No thrush, No neck mass, [place]/AT  Cardiovascular: RRR, S1/S2, no rubs, no gallops  Respiratory: fine bibasilar crackles without any wheezing  Abdomen: Soft/+BS,  non distended, no guarding; LLQ  pain without rebound;     Extremities: No edema, No lymphangitis, No petechiae, No rashes, no synovitis  Genital--mild swelling of the left scrotum without any open wounds or drainage. No penile drainage.   Data  Reviewed: Basic Metabolic Panel:  Recent Labs Lab 06/04/15 1727 06/04/15 2257 06/05/15 0455  NA 135  --  137  K 3.5  --  3.0*  CL 100*  --  104  CO2 23  --  25  GLUCOSE 128*  --  132*  BUN 11  --  10  CREATININE 1.03 1.12 0.85  CALCIUM 9.0  --  7.7*  MG  --   --  1.6*  PHOS  --   --  2.2*   Liver Function Tests:  Recent Labs Lab 06/05/15 0455  AST 19  ALT 17  ALKPHOS 94  BILITOT 1.5*  PROT 5.3*  ALBUMIN 2.9*   No results for input(s): LIPASE, AMYLASE in the last 168 hours. No results for input(s): AMMONIA in the last 168 hours. CBC:  Recent Labs Lab 06/04/15 1727 06/04/15 2257 06/05/15 0455  WBC 14.5* 11.3* 8.3  NEUTROABS  --   --  7.2  HGB 14.6 13.3 11.6*  HCT 41.2 38.8* 33.8*  MCV 89.8 92.4 91.4  PLT 109* 98* 72*   Cardiac Enzymes:  Recent Labs Lab 06/04/15 2257 06/05/15 0455 06/05/15 0945  TROPONINI 0.04* 0.04* <0.03   BNP: Invalid input(s): POCBNP CBG: No results for input(s): GLUCAP in the last 168 hours.  Recent Results (from the past 240 hour(s))  Urine culture     Status: None (Preliminary result)   Collection Time: 06/04/15  5:56 PM  Result Value Ref Range Status   Specimen Description URINE, CLEAN CATCH  Final   Special Requests NONE  Final   Culture TOO YOUNG TO READ  Final   Report Status PENDING  Incomplete     Scheduled Meds: . allopurinol  150 mg Oral Daily  . aspirin EC  81 mg Oral Daily  . atorvastatin  10 mg Oral Daily  . cefTRIAXone (ROCEPHIN)  IV  1 g Intravenous Q24H  . enoxaparin (LOVENOX) injection  40 mg Subcutaneous Q24H  . gabapentin  300 mg Oral TID  . guaiFENesin  600 mg Oral BID  . metoprolol  12.5 mg Oral BID  . pantoprazole  40 mg Oral Daily  . sertraline  150 mg Oral Daily  . sodium chloride  3 mL Intravenous Q12H   Continuous Infusions:    Kelven Flater, DO  [facility] Pager [phone]  If 7PM-7AM, please contact night-coverage [email or web address] Password [email or web address] 06/05/2015, 2:42 PM   LOS: 0  days

## 2015-06-05 NOTE — Progress Notes (Signed)
Pt remains febrile after receiving both tylenol and then later Vicodin. MD on call text paged, will continue to monitor. Troy SineWalker, Maebry Obrien M

## 2015-06-06 ENCOUNTER — Encounter (HOSPITAL_COMMUNITY): Payer: Self-pay

## 2015-06-06 ENCOUNTER — Inpatient Hospital Stay (HOSPITAL_COMMUNITY): Payer: Medicare Other

## 2015-06-06 DIAGNOSIS — N451 Epididymitis: Secondary | ICD-10-CM

## 2015-06-06 DIAGNOSIS — A4151 Sepsis due to Escherichia coli [E. coli]: Principal | ICD-10-CM

## 2015-06-06 LAB — BASIC METABOLIC PANEL
Anion gap: 8 (ref 5–15)
BUN: 7 mg/dL (ref 6–20)
CHLORIDE: 104 mmol/L (ref 101–111)
CO2: 24 mmol/L (ref 22–32)
Calcium: 7.7 mg/dL — ABNORMAL LOW (ref 8.9–10.3)
Creatinine, Ser: 0.93 mg/dL (ref 0.61–1.24)
GFR calc Af Amer: >60 mL/min (ref >60)
GFR calc non Af Amer: >60 mL/min (ref >60)
Glucose, Bld: 159 mg/dL — ABNORMAL HIGH (ref 65–99)
POTASSIUM: 3.3 mmol/L — AB (ref 3.5–5.1)
SODIUM: 136 mmol/L (ref 135–145)

## 2015-06-06 LAB — CBC
HCT: 33.5 % — ABNORMAL LOW (ref 39.0–52.0)
Hemoglobin: 11.4 g/dL — ABNORMAL LOW (ref 13.0–17.0)
MCH: 31.7 pg (ref 26.0–34.0)
MCHC: 34 g/dL (ref 30.0–36.0)
MCV: 93.1 fL (ref 78.0–100.0)
PLATELETS: 69 10*3/uL — AB (ref 150–400)
RBC: 3.6 MIL/uL — ABNORMAL LOW (ref 4.22–5.81)
RDW: 13 % (ref 11.5–15.5)
WBC: 4.4 10*3/uL (ref 4.0–10.5)

## 2015-06-06 LAB — MAGNESIUM: MAGNESIUM: 1.7 mg/dL (ref 1.7–2.4)

## 2015-06-06 LAB — FOLATE RBC
FOLATE, HEMOLYSATE: 357.1 ng/mL
FOLATE, RBC: 1066 ng/mL (ref >498)
Hematocrit: 33.5 % — ABNORMAL LOW (ref 37.5–51.0)

## 2015-06-06 MED ORDER — HYDROCODONE-ACETAMINOPHEN 5-325 MG PO TABS: 1.0000 | ORAL_TABLET | ORAL | Status: AC | PRN

## 2015-06-06 MED ORDER — OXYMETAZOLINE HCL 0.05 % NA SOLN
1.0000 | Freq: Two times a day (BID) | NASAL | Status: DC | PRN
  Filled 2015-06-06: qty 15

## 2015-06-06 MED ORDER — POTASSIUM CHLORIDE CRYS ER 20 MEQ PO TBCR
20.0000 meq | EXTENDED_RELEASE_TABLET | Freq: Once | ORAL | Status: AC
  Administered 2015-06-06: 20 meq via ORAL
  Filled 2015-06-06: qty 1

## 2015-06-06 MED ORDER — LEVOFLOXACIN 750 MG PO TABS: 750.0000 mg | ORAL_TABLET | ORAL | Status: AC

## 2015-06-06 MED ORDER — OXYMETAZOLINE HCL 0.05 % NA SOLN
1.0000 | Freq: Two times a day (BID) | NASAL | Status: DC
  Filled 2015-06-06: qty 15

## 2015-06-06 MED ORDER — MAGNESIUM SULFATE 2 GM/50ML IV SOLN
2.0000 g | Freq: Once | INTRAVENOUS | Status: AC
  Administered 2015-06-06: 2 g via INTRAVENOUS
  Filled 2015-06-06: qty 50

## 2015-06-06 MED ORDER — LEVOFLOXACIN 750 MG PO TABS
750.0000 mg | ORAL_TABLET | ORAL | Status: DC
  Administered 2015-06-06: 750 mg via ORAL
  Filled 2015-06-06: qty 1

## 2015-06-06 MED ORDER — IOHEXOL 300 MG/ML  SOLN
100.0000 mL | Freq: Once | INTRAMUSCULAR | Status: AC | PRN
  Administered 2015-06-06: 100 mL via INTRAVENOUS

## 2015-06-06 NOTE — Discharge Summary (Addendum)
Physician Discharge Summary  Gregory Juarez ZOX:[id number] DOB: August 03, 1950 DOA: 06/04/2015  PCP: PROVIDER NOT IN SYSTEM  Admit date: 06/04/2015 Discharge date: 06/07/15 Recommendations for Outpatient Follow-up:  1. Pt will need to follow up with PCP in 2 weeks post discharge 2. Please obtain BMP and CBC in one week  Discharge Diagnoses:  Sepsis due to E.Coli -Due to genitourinary source--prostatitis and epididymitis -UA shows TNTC WBC -Continue intravenous fluids -Continue intravenous antibiotics pending culture data -Lactic acid 1.04 -Procalcitonin 0.28 -WBC improved 14.5-->4.4 Epididymitis and prostatitis -Scrotal ultrasound--reveals epididymitis  -CT abdomen pelvis--scrotal wall thickening as well as hazy pelvic fat stranding around the prostate with splenomegaly -Urine NAA for GC/Chlamydia--pending -Plan to send home with 11 more days of levofloxacin to complete 14 days -norco prn pain Atypical chest pain -EKG without any concerning ischemic changes -Minimally elevated troponins likely represents demand ischemia -Continue aspirin Hypertension -Hold amlodipine, HCTZ--will not restart as the patient's blood pressure remained stable off these antihypertensives  -Continue low-dose metoprolol tartrate -Patient no longer takes minoxidil Coronary artery disease status post CABG -Continue aspirin and Lipitor -Continue metoprolol tartrate Hypokalemia -Repleted -Check magnesium--1.7 Thrombocytopenia  -Due to splenomegaly and sepsis -Hepatitis B surface antigen and hepatitis C antibody, HIV --pending  -No signs of active bleeding  Discharge Condition: stable  Disposition: home  Diet:heart healthy Wt Readings from Last 3 Encounters:  06/06/15 117.799 kg (259 lb 11.2 oz)    History of present illness:  64 year old male with a history of coronary artery disease status post CABG, hypertension, hyperlipidemia, depression presented with 2 day history of fever up to 102.38F  with associated dysuria and left lower quadrant abdominal pain and left groin pain. The patient also has had left testicular swelling and pain for 2 days prior to admission with dysuria and hematuria. He denies any recent genitourinary manipulation or surgery. Patient had nausea and vomiting 2 days prior to admission, but this has improved. He denies any headache, chest pain, hemoptysis, hematochezia, melena, rashes, synovitis. He has been complaining of pleuritic right-sided chest pain for the past 2 years. He has had dyspnea on exertion after walking about 50 yards which has not changed. He denies any worsening lower extremity edema, increasing abdominal girth, or PND type symptoms. He has had a chronic cough for which she has had an extensive workup including chest x-rays and CTs of the chest which have been unremarkable. The patient takes a PPI at home without any change in his coughing.   Discharge Exam: Filed Vitals:   06/06/15 1406 06/06/15 1707  BP: 125/64 135/71  Pulse: 72 81  Temp: 98.5 F (36.9 C) 99.3 F (37.4 C)  Resp: 18 18   Filed Vitals:   06/06/15 0413 06/06/15 0819 06/06/15 1406 06/06/15 1707  BP: 105/52 119/59 125/64 135/71  Pulse: 76 74 72 81  Temp: 98.9 F (37.2 C) 98.6 F (37 C) 98.5 F (36.9 C) 99.3 F (37.4 C)  TempSrc: Oral Oral Oral Oral  Resp: 18 18 18 18   Height:      Weight: 117.799 kg (259 lb 11.2 oz)     SpO2: 93% 97% 96% 94%   General: A&O x 3, NAD, pleasant, cooperative Cardiovascular: RRR, no rub, no gallop, no S3 Respiratory: CTAB, no wheeze, no rhonchi Abdomen:soft, nontender, nondistended, positive bowel sounds Extremities: No edema, No lymphangitis, no petechiae  Discharge Instructions     Medication List    STOP taking these medications        amLODipine 10 MG tablet  Commonly known as:  NORVASC     hydrochlorothiazide 25 MG tablet  Commonly known as:  HYDRODIURIL     minoxidil 10 MG tablet  Commonly known as:  LONITEN        TAKE these medications        allopurinol 300 MG tablet  Commonly known as:  ZYLOPRIM  Take 150 mg by mouth daily.     aspirin 81 MG tablet  Take 81 mg by mouth daily.     atorvastatin 20 MG tablet  Commonly known as:  LIPITOR  Take 10 mg by mouth daily.     gabapentin 300 MG capsule  Commonly known as:  NEURONTIN  Take 300 mg by mouth 3 (three) times daily.     HYDROcodone-acetaminophen 5-325 MG tablet  Commonly known as:  NORCO/VICODIN  Take 1-2 tablets by mouth every 4 (four) hours as needed for moderate pain.     levofloxacin 750 MG tablet  Commonly known as:  LEVAQUIN  Take 1 tablet (750 mg total) by mouth daily.     metoprolol 50 MG tablet  Commonly known as:  LOPRESSOR  Take 25 mg by mouth daily.     omeprazole 20 MG capsule  Commonly known as:  PRILOSEC  Take 20 mg by mouth daily.     PRESCRIPTION MEDICATION  Place 1 drop into both eyes 4 (four) times daily. Medication: Refresh Classic eye drops     sertraline 100 MG tablet  Commonly known as:  ZOLOFT  Take 150 mg by mouth daily. Splits the  tablet in half to make total of          The results of significant diagnostics from this hospitalization (including imaging, microbiology, ancillary and laboratory) are listed below for reference.    Significant Diagnostic Studies: Dg Chest 2 View  06/04/2015  CLINICAL DATA:  Left testicular swelling. Vomiting. Hallucinations. Chronic back pain. EXAM: CHEST  2 VIEW COMPARISON:  None. FINDINGS: There is no focal parenchymal opacity. There is no pleural effusion or pneumothorax. The heart and mediastinal contours are unremarkable. There is evidence of prior median sternotomy. The osseous structures are unremarkable. IMPRESSION: No active cardiopulmonary disease. Electronically Signed   By: Elige Ko   On: 06/04/2015 17:43   US Scrotum  06/06/2015  CLINICAL DATA:  Left testicular pain for 2 days. EXAM: ULTRASOUND OF SCROTUM TECHNIQUE: Complete ultrasound  examination of the testicles, epididymis, and other scrotal structures was performed. COMPARISON:  None. FINDINGS: Right testicle Measurements: 43 x 26 x 28 mm. No mass or microlithiasis visualized. Left testicle Measurements: 42 x 25 x 33 mm. No mass or microlithiasis visualized. Right epididymis: Normal size and vascularity. Small incidental cysts. Left epididymis:  Enlarged and hypervascular Hydrocele:  Small and simple on the left Varicocele: Present on the left with veins dilating up to 4 mm. Borderline right varicocele. IMPRESSION: 1. Left epididymitis. 2. Left varicocele. Electronically Signed   By: Marnee Spring M.D.   On: 06/06/2015 02:08   Ct Abdomen Pelvis W Contrast  06/06/2015  CLINICAL DATA:  64 year old male with intermittent CVA diffuse abdominal pain. Left testicular pain and swelling. EXAM: CT ABDOMEN AND PELVIS WITH CONTRAST TECHNIQUE: Multidetector CT imaging of the abdomen and pelvis was performed using the standard protocol following bolus administration of intravenous contrast. CONTRAST:  OMNIPAQUE IOHEXOL 300 MG/ML  SOLN COMPARISON:  Testicular ultrasound dated 06/05/2015 FINDINGS: Minimal bibasilar linear atelectatic changes/ scarring. The visualized lung bases are clear. The There is coronary vascular calcification.  No intraperitoneal free air or free fluid. Cholecystectomy. Focal ill-defined hypodensity in the medial segment of the left lobe of the liver adjacent to the gallbladder fossa most compatible with focal fatty infiltration. The liver, pancreas, and adrenal glands appear unremarkable. Splenomegaly measuring up to 19 cm. Small splenule. Mild bilateral renal atrophy. Multiple bilateral renal hypodense lesions noted measuring up to 1.9 cm some of which represent cysts and others are too small to characterize. There is no hydronephrosis. The visualized ureters and urinary bladder appear unremarkable. There is mild enlargement of the prostate gland measuring 5.5 cm in  transverse axial dimension. There is haziness of the pelvic fat surrounding the prostate. There is thickening and edema of the scrotal wall. Correlation with urinalysis and clinical exam recommended to evaluate for urogenital infection. No drainable fluid collection/abscess. There is sigmoid diverticulosis without active inflammation. No evidence of bowel obstruction. A 1.7 cm duodenal diverticulum noted without active inflammation. The appendix is not visualized with certainty. No inflammatory changes identified in the right lower quadrant. The abdominal aorta and IVC appear patent. The origins of the celiac axis, SMA, IMA as well as the origins of the renal arteries are patent. No portal venous gas identified. Small retroperitoneal and iliac chain lymph nodes noted. There is no adenopathy. Midline vertical anterior abdominal wall incisional scar. The abdominal wall soft tissues appear unremarkable. There is degenerative changes of the spine. No acute fracture. Median sternotomy wires noted. IMPRESSION: Diffuse thickening of the scrotal wall with haziness of the fat surrounding the prostate gland concerning for urogenital infection. Clinical correlation is recommended. No drainable fluid collection/abscess. No hydronephrosis. Diverticulosis.  No evidence of bowel obstruction or inflammation. Electronically Signed   By: Elgie Collard M.D.   On: 06/06/2015 02:18     Microbiology: Recent Results (from the past 240 hour(s))  Urine culture     Status: None (Preliminary result)   Collection Time: 06/04/15  5:56 PM  Result Value Ref Range Status   Specimen Description URINE, CLEAN CATCH  Final   Special Requests NONE  Final   Culture >=100,000 COLONIES/mL ESCHERICHIA COLI  Final   Report Status PENDING  Incomplete  Culture, blood (Routine X 2) w Reflex to ID Panel     Status: None (Preliminary result)   Collection Time: 06/04/15  6:15 PM  Result Value Ref Range Status   Specimen Description BLOOD RIGHT  HAND  Final   Special Requests BOTTLES DRAWN AEROBIC AND ANAEROBIC 5CC  Final   Culture NO GROWTH 2 DAYS  Final   Report Status PENDING  Incomplete  Culture, blood (Routine X 2) w Reflex to ID Panel     Status: None (Preliminary result)   Collection Time: 06/04/15  6:45 PM  Result Value Ref Range Status   Specimen Description BLOOD RIGHT ANTECUBITAL  Final   Special Requests BOTTLES DRAWN AEROBIC AND ANAEROBIC 10CC  Final   Culture NO GROWTH 1 DAY  Final   Report Status PENDING  Incomplete     Labs: Basic Metabolic Panel:  Recent Labs Lab 06/04/15 1727 06/04/15 2257 06/05/15 0455 06/06/15 0444  NA 135  --  137 136  K 3.5  --  3.0* 3.3*  CL 100*  --  104 104  CO2 23  --  25 24  GLUCOSE 128*  --  132* 159*  BUN 11  --  10 7  CREATININE 1.03 1.12 0.85 0.93  CALCIUM 9.0  --  7.7* 7.7*  MG  --   --  1.6* 1.7  PHOS  --   --  2.2*  --    Liver Function Tests:  Recent Labs Lab 06/05/15 0455  AST 19  ALT 17  ALKPHOS 94  BILITOT 1.5*  PROT 5.3*  ALBUMIN 2.9*   No results for input(s): LIPASE, AMYLASE in the last 168 hours. No results for input(s): AMMONIA in the last 168 hours. CBC:  Recent Labs Lab 06/04/15 1727 06/04/15 2257 06/05/15 0455 06/06/15 0444  WBC 14.5* 11.3* 8.3 4.4  NEUTROABS  --   --  7.2  --   HGB 14.6 13.3 11.6* 11.4*  HCT 41.2 38.8* 33.8*  33.5* 33.5*  MCV 89.8 92.4 91.4 93.1  PLT 109* 98* 72* 69*   Cardiac Enzymes:  Recent Labs Lab 06/04/15 2257 06/05/15 0455 06/05/15 0945  TROPONINI 0.04* 0.04* <0.03   BNP: Invalid input(s): POCBNP CBG: No results for input(s): GLUCAP in the last 168 hours.  Time coordinating discharge:  Greater than 30 minutes  Signed:  Neelah Mannings, DO [facility] Pager: [phone] 06/06/2015, 6:41 PM

## 2015-06-07 DIAGNOSIS — N410 Acute prostatitis: Secondary | ICD-10-CM

## 2015-06-07 DIAGNOSIS — I10 Essential (primary) hypertension: Secondary | ICD-10-CM

## 2015-06-07 DIAGNOSIS — I2510 Atherosclerotic heart disease of native coronary artery without angina pectoris: Secondary | ICD-10-CM

## 2015-06-07 LAB — URINE CULTURE

## 2015-06-07 LAB — CBC
HCT: 34.8 % — ABNORMAL LOW (ref 39.0–52.0)
HEMOGLOBIN: 11.6 g/dL — AB (ref 13.0–17.0)
MCH: 31.4 pg (ref 26.0–34.0)
MCHC: 33.3 g/dL (ref 30.0–36.0)
MCV: 94.1 fL (ref 78.0–100.0)
Platelets: 80 10*3/uL — ABNORMAL LOW (ref 150–400)
RBC: 3.7 MIL/uL — AB (ref 4.22–5.81)
RDW: 13 % (ref 11.5–15.5)
WBC: 2.8 10*3/uL — ABNORMAL LOW (ref 4.0–10.5)

## 2015-06-07 LAB — BASIC METABOLIC PANEL
ANION GAP: 7 (ref 5–15)
BUN: 7 mg/dL (ref 6–20)
CALCIUM: 7.9 mg/dL — AB (ref 8.9–10.3)
CO2: 27 mmol/L (ref 22–32)
Chloride: 104 mmol/L (ref 101–111)
Creatinine, Ser: 0.83 mg/dL (ref 0.61–1.24)
Glucose, Bld: 108 mg/dL — ABNORMAL HIGH (ref 65–99)
Potassium: 3.7 mmol/L (ref 3.5–5.1)
Sodium: 138 mmol/L (ref 135–145)

## 2015-06-07 LAB — VITAMIN B1: Vitamin B1 (Thiamine): 119.1 nmol/L (ref 66.5–200.0)

## 2015-06-07 NOTE — Progress Notes (Signed)
Pt being discharged home via wheelchair with family. Pt alert and oriented x4. VSS. Pt c/o no pain at this time. No signs of respiratory distress. Education complete and care plans resolved. IV removed with catheter intact and pt tolerated well. No further issues at this time. Pt to follow up with PCP. Jafeth Mustin R, RN 

## 2015-06-07 NOTE — Discharge Summary (Signed)
Physician Discharge Summary  Gregory Juarez ZOX:[id number] DOB: 1950/09/13 DOA: 06/04/2015  PCP: PROVIDER NOT IN SYSTEM  Admit date: 06/04/2015 Discharge date: 06/07/15 Recommendations for Outpatient Follow-up:  1. Pt will need to follow up with PCP in 2 weeks post discharge 2. Please obtain BMP and CBC in one week  Discharge Diagnoses:  Sepsis -Due to genitourinary source--prostatitis and epididymitis -UA shows TNTC WBC -Continue intravenous fluids -Continue intravenous antibiotics pending culture data -Lactic acid 1.04 -Procalcitonin 0.28 -WBC improved 14.5-->4.4 Epididymitis and prostatitis -Scrotal ultrasound--reveals epididymitis  -CT abdomen pelvis--scrotal wall thickening as well as hazy pelvic fat stranding around the prostate with splenomegaly -Urine NAA for GC/Chlamydia--pending -Plan to send home with 11 more days of levofloxacin to complete 14 days -norco prn pain Atypical chest pain -EKG without any concerning ischemic changes -Minimally elevated troponins likely represents demand ischemia -Continue aspirin Hypertension -Hold amlodipine, HCTZ--will not restart as the patient's blood pressure remained stable off these antihypertensives  -Continue low-dose metoprolol tartrate -Patient no longer takes minoxidil Coronary artery disease status post CABG -Continue aspirin and Lipitor -Continue metoprolol tartrate Hypokalemia -Repleted -Check magnesium--1.7 Thrombocytopenia  -Due to splenomegaly and sepsis -Hepatitis B surface antigen and hepatitis C antibody, HIV --pending  -No signs of active bleeding  Discharge Condition: stable  Disposition: home  Diet:heart healthy Wt Readings from Last 3 Encounters:  06/07/15 117.799 kg (259 lb 11.2 oz)    History of present illness:  64 year old male with a history of coronary artery disease status post CABG, hypertension, hyperlipidemia, depression presented with 2 day history of fever up to 102.52F with  associated dysuria and left lower quadrant abdominal pain and left groin pain. The patient also has had left testicular swelling and pain for 2 days prior to admission with dysuria and hematuria. He denies any recent genitourinary manipulation or surgery. Patient had nausea and vomiting 2 days prior to admission, but this has improved. He denies any headache, chest pain, hemoptysis, hematochezia, melena, rashes, synovitis. He has been complaining of pleuritic right-sided chest pain for the past 2 years. He has had dyspnea on exertion after walking about 50 yards which has not changed. He denies any worsening lower extremity edema, increasing abdominal girth, or PND type symptoms. He has had a chronic cough for which she has had an extensive workup including chest x-rays and CTs of the chest which have been unremarkable. The patient takes a PPI at home without any change in his coughing.   Discharge Exam: Filed Vitals:   06/07/15 0458 06/07/15 1200  BP: 140/72 121/70  Pulse: 81 65  Temp: 98.7 F (37.1 C) 98.5 F (36.9 C)  Resp: 18 20   Filed Vitals:   06/06/15 1707 06/06/15 2027 06/07/15 0458 06/07/15 1200  BP: 135/71 100/55 140/72 121/70  Pulse: 81 78 81 65  Temp: 99.3 F (37.4 C) 99.1 F (37.3 C) 98.7 F (37.1 C) 98.5 F (36.9 C)  TempSrc: Oral Oral Oral Oral  Resp: 18 18 18 20   Height:      Weight:   117.799 kg (259 lb 11.2 oz)   SpO2: 94% 93% 95% 94%   General: A&O x 3, NAD, pleasant, cooperative Cardiovascular: RRR, no rub, no gallop, no S3 Respiratory: CTAB, no wheeze, no rhonchi Abdomen:soft, nontender, nondistended, positive bowel sounds Extremities: No edema, No lymphangitis, no petechiae  Discharge Instructions      Discharge Instructions    Diet - low sodium heart healthy    Complete by:  As directed  Increase activity slowly    Complete by:  As directed             Medication List    STOP taking these medications        amLODipine 10 MG tablet    Commonly known as:  NORVASC     hydrochlorothiazide 25 MG tablet  Commonly known as:  HYDRODIURIL     minoxidil 10 MG tablet  Commonly known as:  LONITEN      TAKE these medications        allopurinol 300 MG tablet  Commonly known as:  ZYLOPRIM  Take 150 mg by mouth daily.     aspirin 81 MG tablet  Take 81 mg by mouth daily.     atorvastatin 20 MG tablet  Commonly known as:  LIPITOR  Take 10 mg by mouth daily.     gabapentin 300 MG capsule  Commonly known as:  NEURONTIN  Take 300 mg by mouth 3 (three) times daily.     HYDROcodone-acetaminophen 5-325 MG tablet  Commonly known as:  NORCO/VICODIN  Take 1-2 tablets by mouth every 4 (four) hours as needed for moderate pain.     levofloxacin 750 MG tablet  Commonly known as:  LEVAQUIN  Take 1 tablet (750 mg total) by mouth daily.     metoprolol 50 MG tablet  Commonly known as:  LOPRESSOR  Take 25 mg by mouth daily.     omeprazole 20 MG capsule  Commonly known as:  PRILOSEC  Take 20 mg by mouth daily.     PRESCRIPTION MEDICATION  Place 1 drop into both eyes 4 (four) times daily. Medication: Refresh Classic eye drops     sertraline 100 MG tablet  Commonly known as:  ZOLOFT  Take 150 mg by mouth daily. Splits the  tablet in half to make total of          The results of significant diagnostics from this hospitalization (including imaging, microbiology, ancillary and laboratory) are listed below for reference.    Significant Diagnostic Studies: Dg Chest 2 View  06/04/2015  CLINICAL DATA:  Left testicular swelling. Vomiting. Hallucinations. Chronic back pain. EXAM: CHEST  2 VIEW COMPARISON:  None. FINDINGS: There is no focal parenchymal opacity. There is no pleural effusion or pneumothorax. The heart and mediastinal contours are unremarkable. There is evidence of prior median sternotomy. The osseous structures are unremarkable. IMPRESSION: No active cardiopulmonary disease. Electronically Signed   By: Elige Ko   On: 06/04/2015 17:43   US Scrotum  06/06/2015  CLINICAL DATA:  Left testicular pain for 2 days. EXAM: ULTRASOUND OF SCROTUM TECHNIQUE: Complete ultrasound examination of the testicles, epididymis, and other scrotal structures was performed. COMPARISON:  None. FINDINGS: Right testicle Measurements: 43 x 26 x 28 mm. No mass or microlithiasis visualized. Left testicle Measurements: 42 x 25 x 33 mm. No mass or microlithiasis visualized. Right epididymis: Normal size and vascularity. Small incidental cysts. Left epididymis:  Enlarged and hypervascular Hydrocele:  Small and simple on the left Varicocele: Present on the left with veins dilating up to 4 mm. Borderline right varicocele. IMPRESSION: 1. Left epididymitis. 2. Left varicocele. Electronically Signed   By: Marnee Spring M.D.   On: 06/06/2015 02:08   Ct Abdomen Pelvis W Contrast  06/06/2015  CLINICAL DATA:  64 year old male with intermittent CVA diffuse abdominal pain. Left testicular pain and swelling. EXAM: CT ABDOMEN AND PELVIS WITH CONTRAST TECHNIQUE: Multidetector CT imaging of the abdomen and pelvis was performed  using the standard protocol following bolus administration of intravenous contrast. CONTRAST:  100mL OMNIPAQUE IOHEXOL 300 MG/ML  SOLN COMPARISON:  Testicular ultrasound dated 06/05/2015 FINDINGS: Minimal bibasilar linear atelectatic changes/ scarring. The visualized lung bases are clear. The There is coronary vascular calcification. No intraperitoneal free air or free fluid. Cholecystectomy. Focal ill-defined hypodensity in the medial segment of the left lobe of the liver adjacent to the gallbladder fossa most compatible with focal fatty infiltration. The liver, pancreas, and adrenal glands appear unremarkable. Splenomegaly measuring up to 19 cm. Small splenule. Mild bilateral renal atrophy. Multiple bilateral renal hypodense lesions noted measuring up to 1.9 cm some of which represent cysts and others are too small to  characterize. There is no hydronephrosis. The visualized ureters and urinary bladder appear unremarkable. There is mild enlargement of the prostate gland measuring 5.5 cm in transverse axial dimension. There is haziness of the pelvic fat surrounding the prostate. There is thickening and edema of the scrotal wall. Correlation with urinalysis and clinical exam recommended to evaluate for urogenital infection. No drainable fluid collection/abscess. There is sigmoid diverticulosis without active inflammation. No evidence of bowel obstruction. A 1.7 cm duodenal diverticulum noted without active inflammation. The appendix is not visualized with certainty. No inflammatory changes identified in the right lower quadrant. The abdominal aorta and IVC appear patent. The origins of the celiac axis, SMA, IMA as well as the origins of the renal arteries are patent. No portal venous gas identified. Small retroperitoneal and iliac chain lymph nodes noted. There is no adenopathy. Midline vertical anterior abdominal wall incisional scar. The abdominal wall soft tissues appear unremarkable. There is degenerative changes of the spine. No acute fracture. Median sternotomy wires noted. IMPRESSION: Diffuse thickening of the scrotal wall with haziness of the fat surrounding the prostate gland concerning for urogenital infection. Clinical correlation is recommended. No drainable fluid collection/abscess. No hydronephrosis. Diverticulosis.  No evidence of bowel obstruction or inflammation. Electronically Signed   By: Elgie CollardArash  Radparvar M.D.   On: 06/06/2015 02:18     Microbiology: Recent Results (from the past 240 hour(s))  Urine culture     Status: None   Collection Time: 06/04/15  5:56 PM  Result Value Ref Range Status   Specimen Description URINE, CLEAN CATCH  Final   Special Requests NONE  Final   Culture >=100,000 COLONIES/mL ESCHERICHIA COLI  Final   Report Status 06/07/2015 FINAL  Final   Organism ID, Bacteria ESCHERICHIA  COLI  Final      Susceptibility   Escherichia coli - MIC*    AMPICILLIN <=2 SENSITIVE Sensitive     CEFAZOLIN <=4 SENSITIVE Sensitive     CEFTRIAXONE <=1 SENSITIVE Sensitive     CIPROFLOXACIN <=0.25 SENSITIVE Sensitive     GENTAMICIN <=1 SENSITIVE Sensitive     IMIPENEM <=0.25 SENSITIVE Sensitive     NITROFURANTOIN <=16 SENSITIVE Sensitive     TRIMETH/SULFA <=20 SENSITIVE Sensitive     AMPICILLIN/SULBACTAM <=2 SENSITIVE Sensitive     PIP/TAZO <=4 SENSITIVE Sensitive     * >=100,000 COLONIES/mL ESCHERICHIA COLI  Culture, blood (Routine X 2) w Reflex to ID Panel     Status: None (Preliminary result)   Collection Time: 06/04/15  6:15 PM  Result Value Ref Range Status   Specimen Description BLOOD RIGHT HAND  Final   Special Requests BOTTLES DRAWN AEROBIC AND ANAEROBIC 5CC  Final   Culture NO GROWTH 3 DAYS  Final   Report Status PENDING  Incomplete  Culture, blood (Routine X 2) w Reflex  to ID Panel     Status: None (Preliminary result)   Collection Time: 06/04/15  6:45 PM  Result Value Ref Range Status   Specimen Description BLOOD RIGHT ANTECUBITAL  Final   Special Requests BOTTLES DRAWN AEROBIC AND ANAEROBIC 10CC  Final   Culture NO GROWTH 2 DAYS  Final   Report Status PENDING  Incomplete     Labs: Basic Metabolic Panel:  Recent Labs Lab 06/04/15 1727 06/04/15 2257 06/05/15 0455 06/06/15 0444 06/07/15 0305  NA 135  --  137 136 138  K 3.5  --  3.0* 3.3* 3.7  CL 100*  --  104 104 104  CO2 23  --  GLUCOSE 128*  --  132* 159* 108*  BUN 11  --  CREATININE 1.03 1.12 0.85 0.93 0.83  CALCIUM 9.0  --  7.7* 7.7* 7.9*  MG  --   --  1.6* 1.7  --   PHOS  --   --  2.2*  --   --    Liver Function Tests:  Recent Labs Lab 06/05/15 0455  AST 19  ALT 17  ALKPHOS 94  BILITOT 1.5*  PROT 5.3*  ALBUMIN 2.9*   No results for input(s): LIPASE, AMYLASE in the last 168 hours. No results for input(s): AMMONIA in the last 168 hours. CBC:  Recent Labs Lab  06/04/15 1727 06/04/15 2257 06/05/15 0455 06/06/15 0444 06/07/15 0305  WBC 14.5* 11.3* 8.3 4.4 2.8*  NEUTROABS  --   --  7.2  --   --   HGB 14.6 13.3 11.6* 11.4* 11.6*  HCT 41.2 38.8* 33.8*  33.5* 33.5* 34.8*  MCV 89.8 92.4 91.4 93.1 94.1  PLT 109* 98* 72* 69* 80*   Cardiac Enzymes:  Recent Labs Lab 06/04/15 2257 06/05/15 0455 06/05/15 0945  TROPONINI 0.04* 0.04* <0.03   BNP: Invalid input(s): POCBNP CBG: No results for input(s): GLUCAP in the last 168 hours.  Time coordinating discharge:  Greater than 30 minutes  Signed:  Hollice Espy, DO [facility] Pager: [phone] 06/07/2015, 4:20 PM

## 2015-06-08 LAB — HEPATITIS B SURFACE ANTIGEN: HEP B S AG: NEGATIVE

## 2015-06-08 LAB — HEPATITIS C ANTIBODY: HCV Ab: <0.1 s/co ratio (ref 0.0–0.9)

## 2015-06-09 LAB — CULTURE, BLOOD (ROUTINE X 2): Culture: NO GROWTH

## 2015-06-10 LAB — CULTURE, BLOOD (ROUTINE X 2): CULTURE: NO GROWTH

## 2015-06-10 LAB — HEMOGLOBIN A1C
HEMOGLOBIN A1C: 5.6 % (ref 4.8–5.6)
MEAN PLASMA GLUCOSE: 114 mg/dL

## 2015-06-10 LAB — GC/CHLAMYDIA PROBE AMP (~~LOC~~) NOT AT ARMC
Chlamydia: NEGATIVE
NEISSERIA GONORRHEA: NEGATIVE

## 2015-11-03 DIAGNOSIS — J111 Influenza due to unidentified influenza virus with other respiratory manifestations: Secondary | ICD-10-CM | POA: Diagnosis not present

## 2016-01-20 IMAGING — US US SCROTUM
1 series · 14 of 25 positions shown · non-contrast
Comparison: None.

CLINICAL DATA: Left testicular pain for 2 days.

EXAM:
ULTRASOUND OF SCROTUM
TECHNIQUE: Complete ultrasound examination of the testicles, epididymis, and
other scrotal structures was performed.

[Series 1: us scrotum · 0.08mm/px · 14 of 67 slices shown]
[im 1/67]
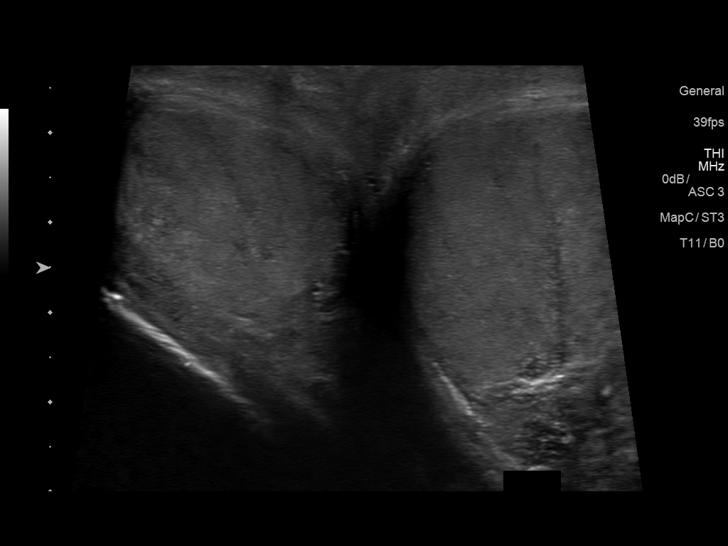
[im 6/67]
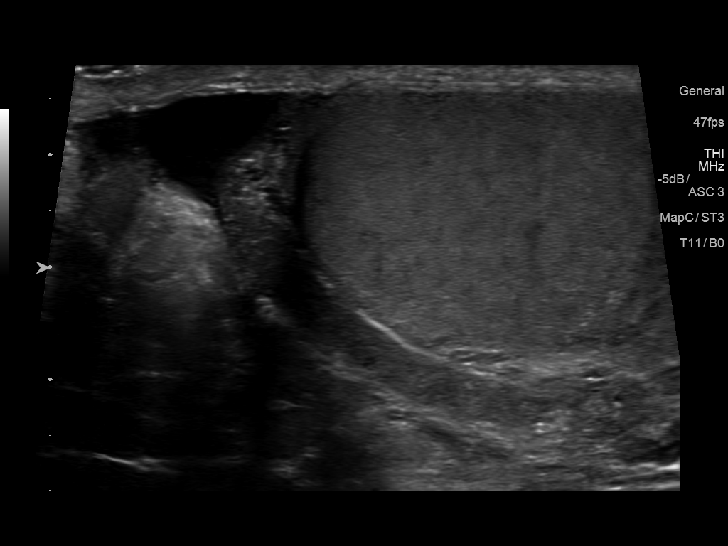
[im 12/67]
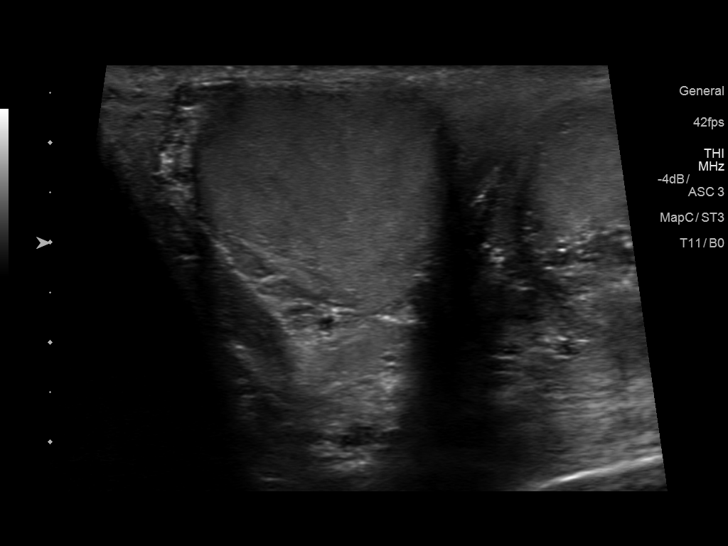
[im 17/67]
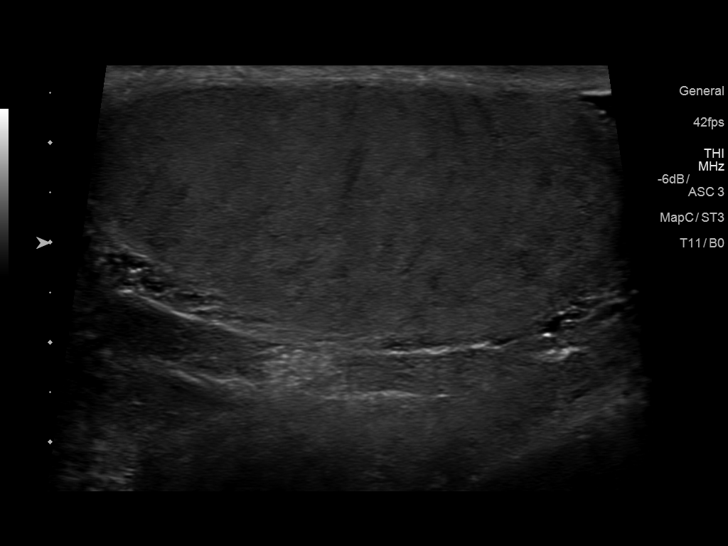
[im 23/67]
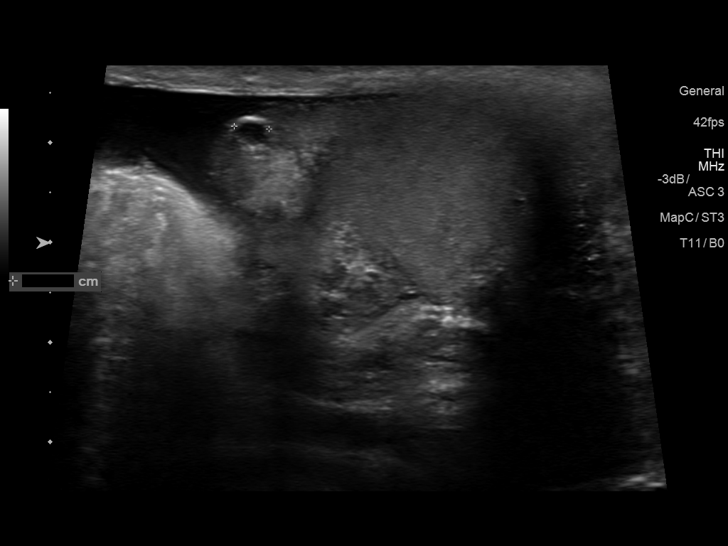
[im 25/67]
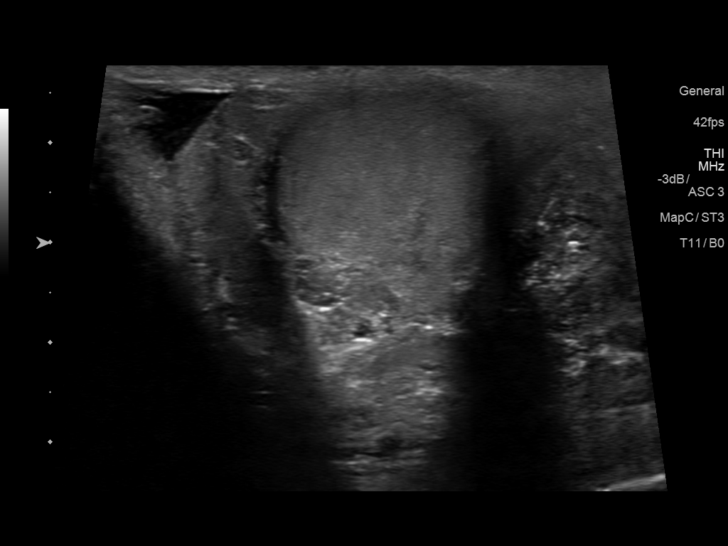
[im 31/67]
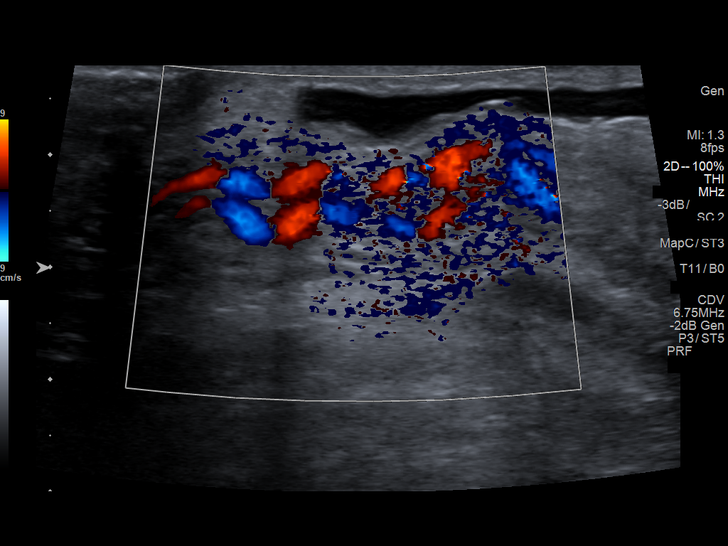
[im 36/67]
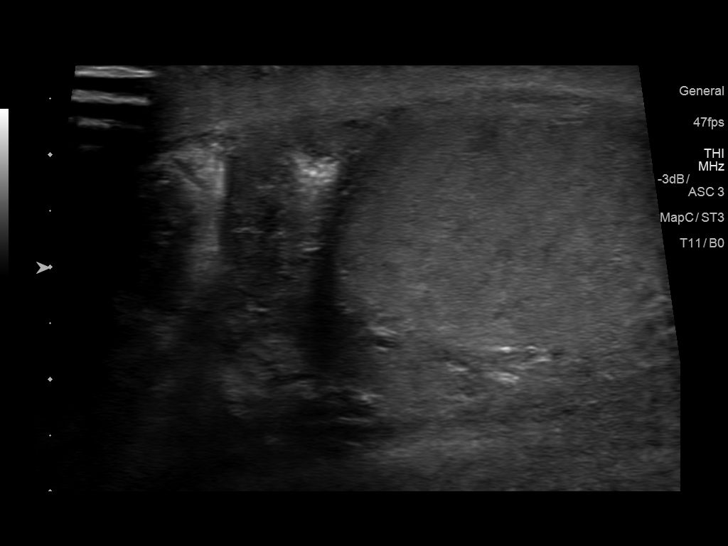
[im 42/67]
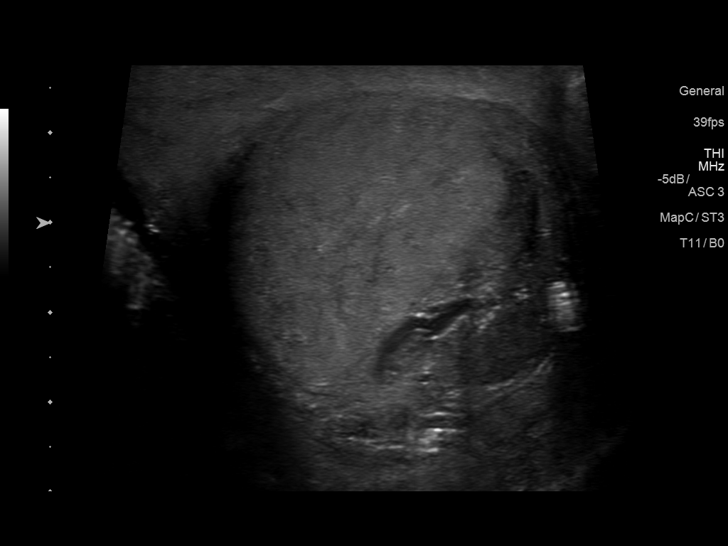
[im 45/67]
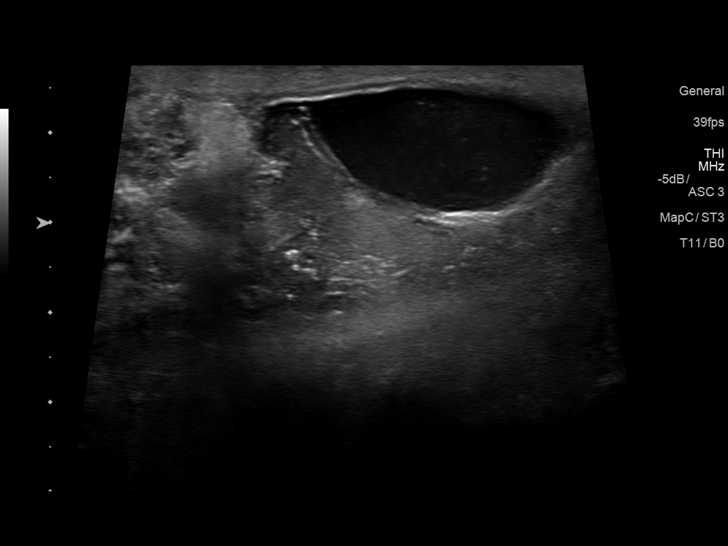
[im 50/67]
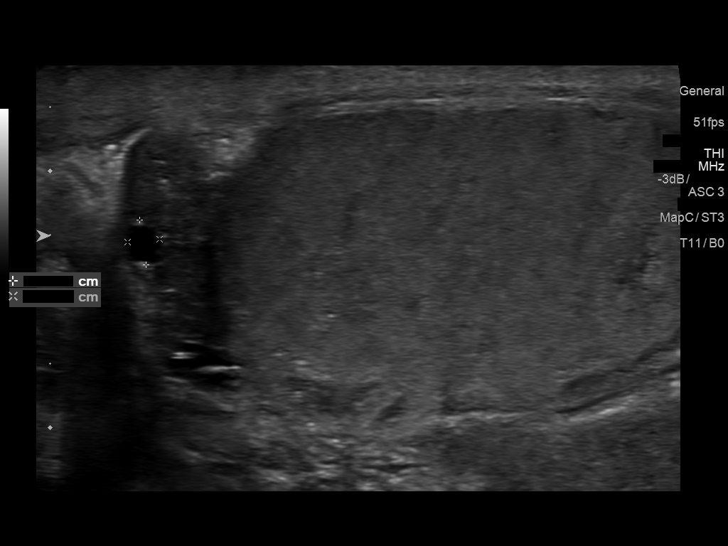
[im 56/67]
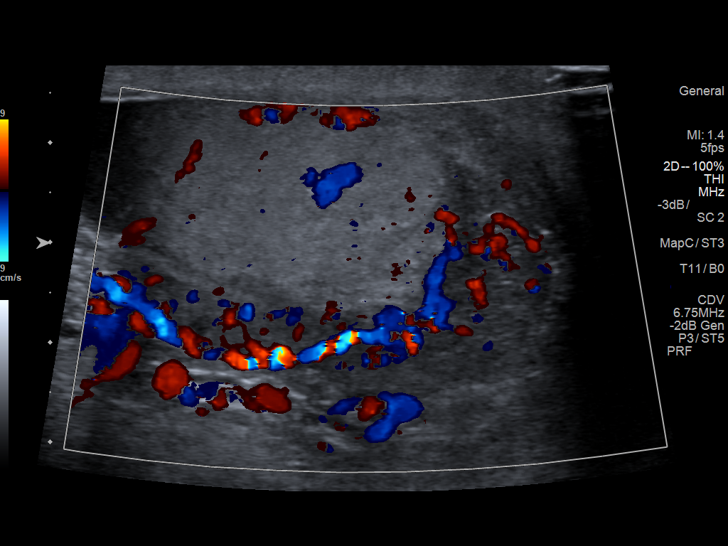
[im 61/67]
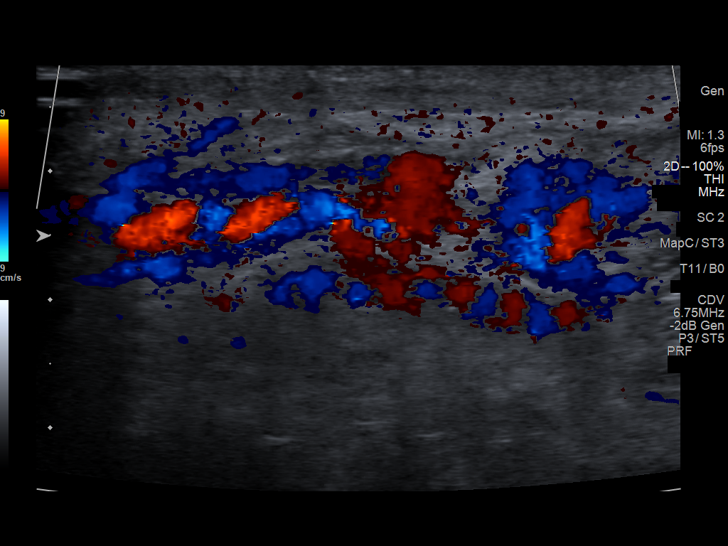
[im 67/67]
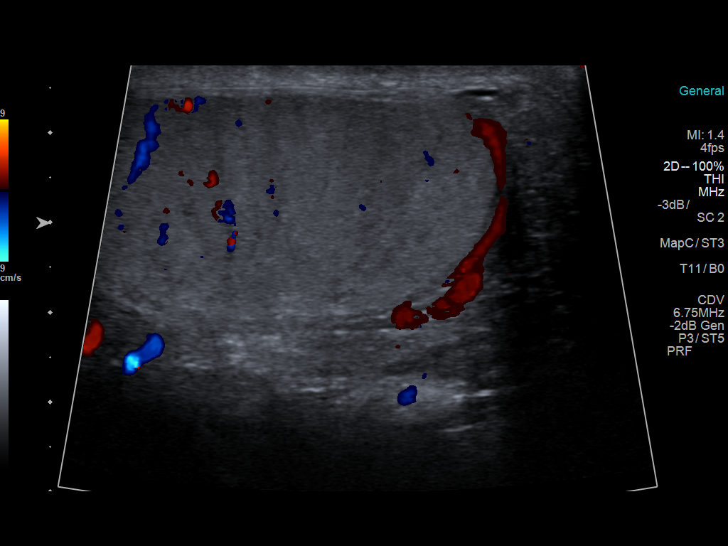

[14 of 25 positions shown; findings below may reference images not displayed]

FINDINGS: Right testicle

Measurements: 43 x 26 x 28 mm. No mass or microlithiasis visualized.

Left testicle

Measurements: 42 x 25 x 33 mm. No mass or microlithiasis visualized.

Right epididymis: Normal size and vascularity. Small incidental
cysts.

Left epididymis:  Enlarged and hypervascular

Hydrocele:  Small and simple on the left

Varicocele: Present on the left with veins dilating up to 4 mm.
Borderline right varicocele.
IMPRESSION: 1. Left epididymitis.
2. Left varicocele.

## 2016-02-02 DIAGNOSIS — S61411A Laceration without foreign body of right hand, initial encounter: Secondary | ICD-10-CM | POA: Diagnosis not present

## 2016-02-02 DIAGNOSIS — S6991XA Unspecified injury of right wrist, hand and finger(s), initial encounter: Secondary | ICD-10-CM | POA: Diagnosis not present

## 2016-02-23 IMAGING — CR DG CHEST 2V
2 series · 2 of 2 positions shown · non-contrast
Comparison: None.

CLINICAL DATA: Left testicular swelling. Vomiting. Hallucinations.
Chronic back pain.

EXAM:
CHEST  2 VIEW

[chest pa]
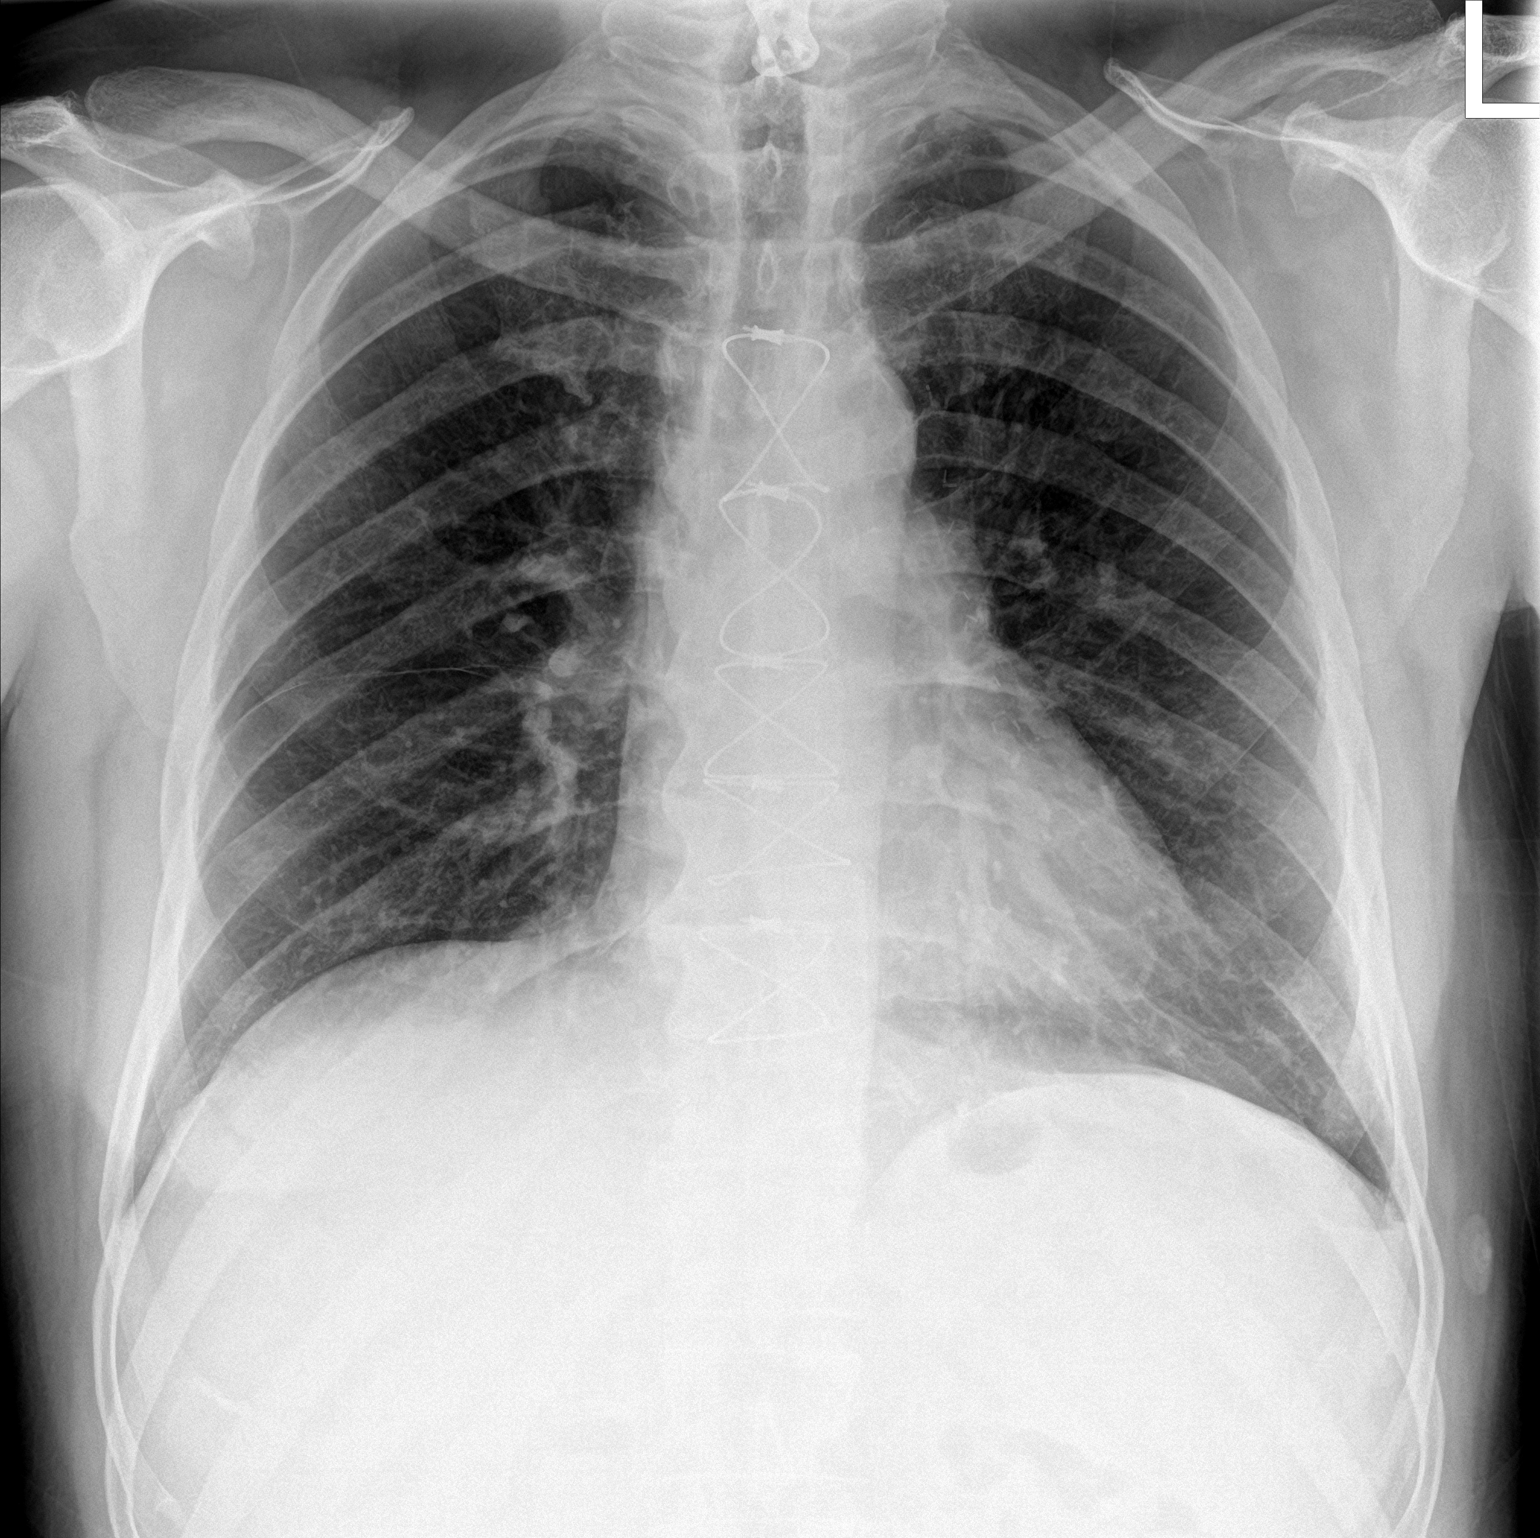

[chest lat]
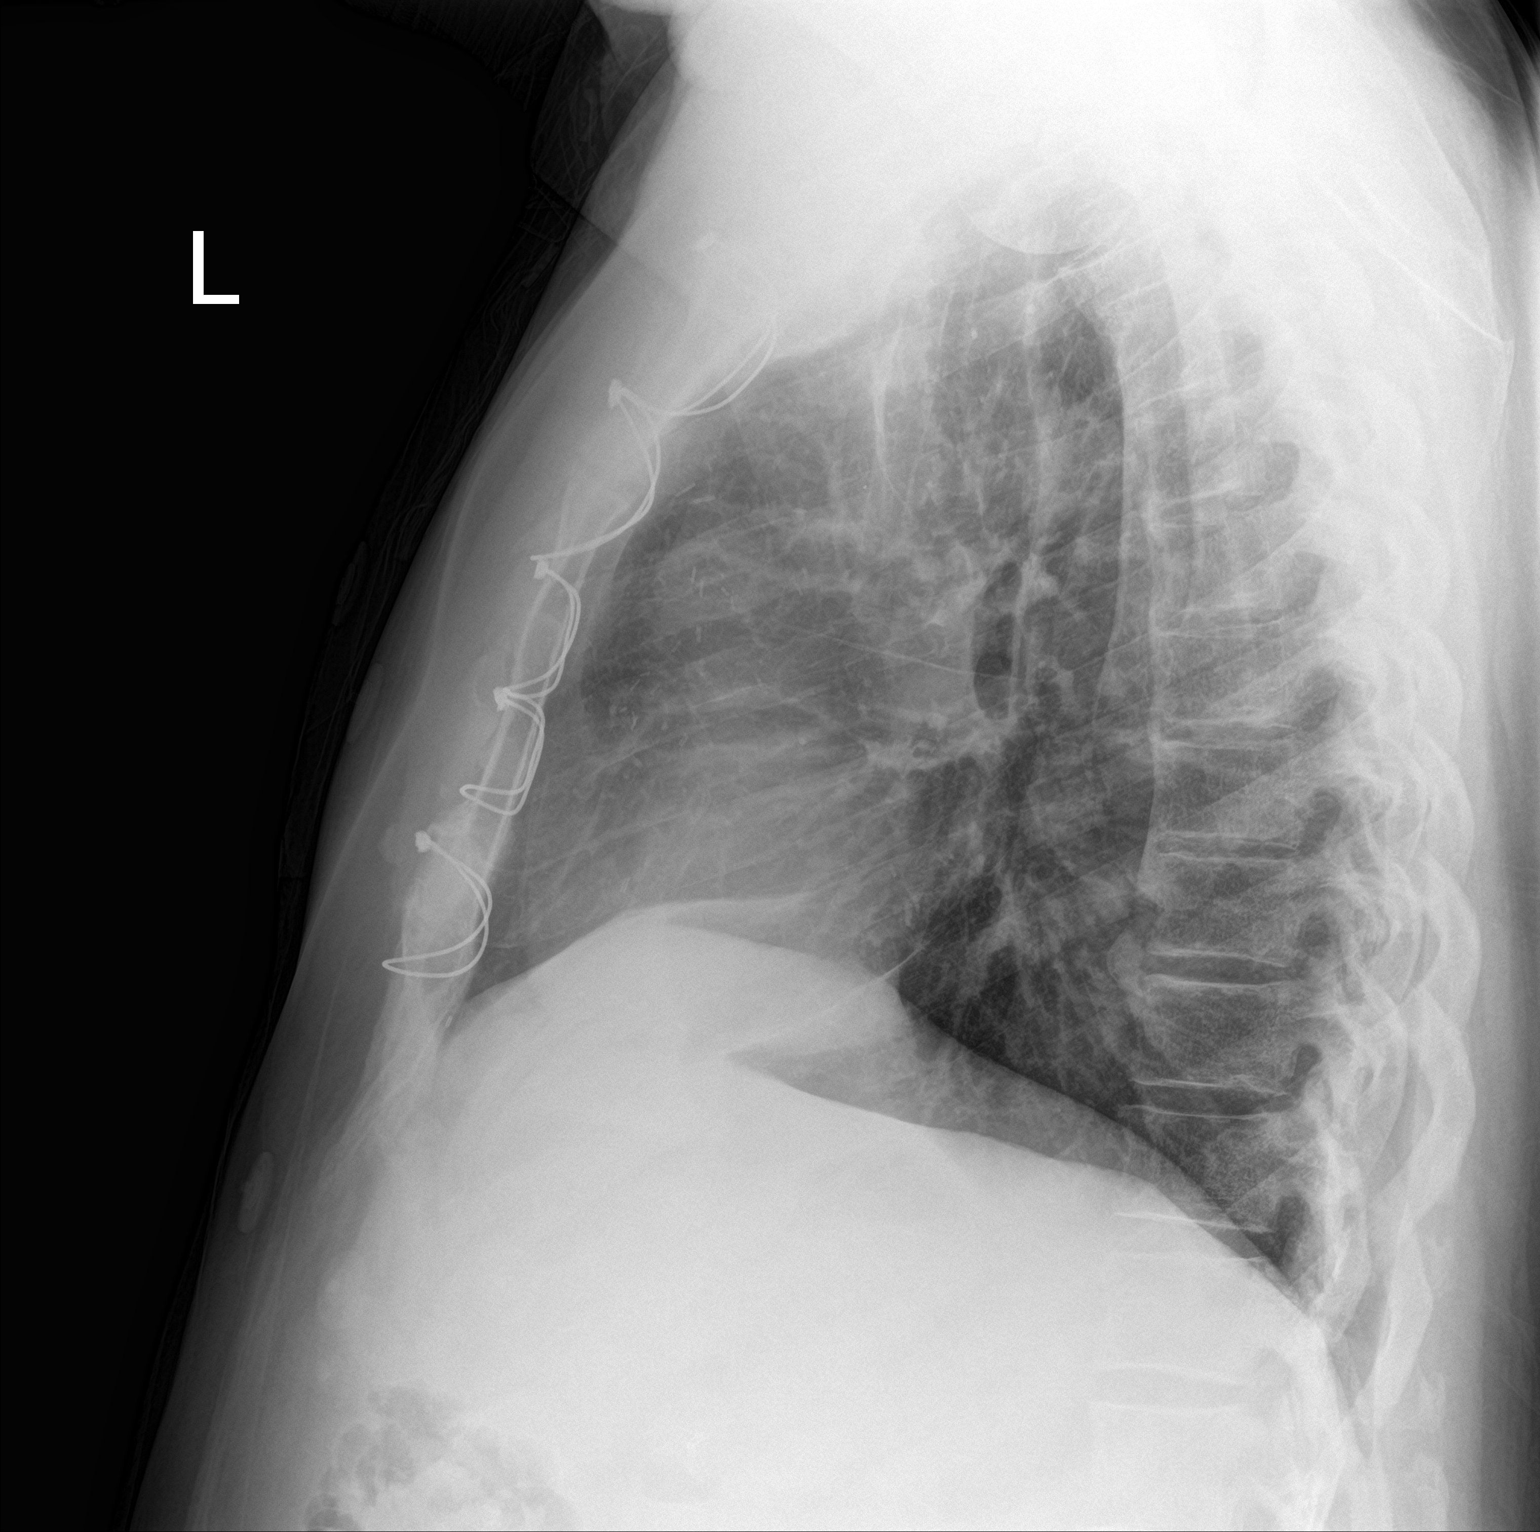

[2 of 2 positions shown; findings below may reference images not displayed]

FINDINGS: There is no focal parenchymal opacity. There is no pleural effusion
or pneumothorax. The heart and mediastinal contours are
unremarkable. There is evidence of prior median sternotomy.

The osseous structures are unremarkable.
IMPRESSION: No active cardiopulmonary disease.

## 2016-02-25 IMAGING — CT CT ABD-PELV W/ CM
2 of 5 series · 14 of 46 positions shown, 16 images · IV contrast (omnipaque)
Comparison: Testicular ultrasound dated 06/05/2015

CLINICAL DATA: 64-year-old male with intermittent CVA diffuse
abdominal pain. Left testicular pain and swelling.

EXAM:
CT ABDOMEN AND PELVIS WITH CONTRAST
TECHNIQUE: Multidetector CT imaging of the abdomen and pelvis was performed
using the standard protocol following bolus administration of
intravenous contrast.
CONTRAST:  100mL OMNIPAQUE IOHEXOL 300 MG/ML  SOLN

[Series 2: abd/ pelvis 5.0 i30f 1 · axial · 0.85mm/px · z∈[-803,-133]mm · 11 of 150 slices shown, 13 images]
[im 8/150  soft-tissue]
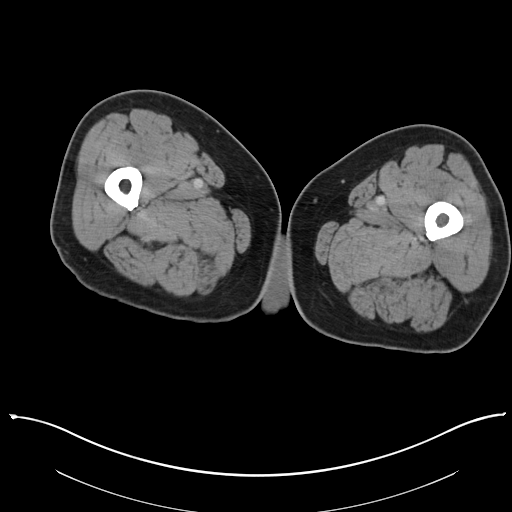
[im 8/150  bone]
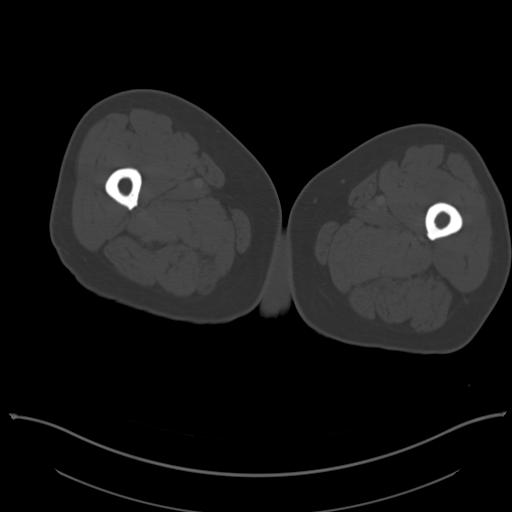
[im 23/150  soft-tissue]
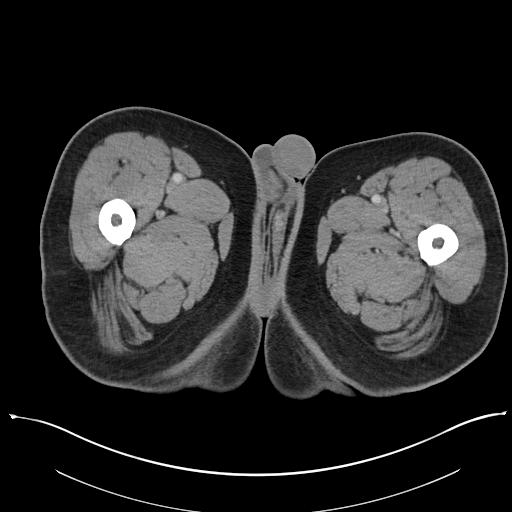
[im 38/150  soft-tissue]
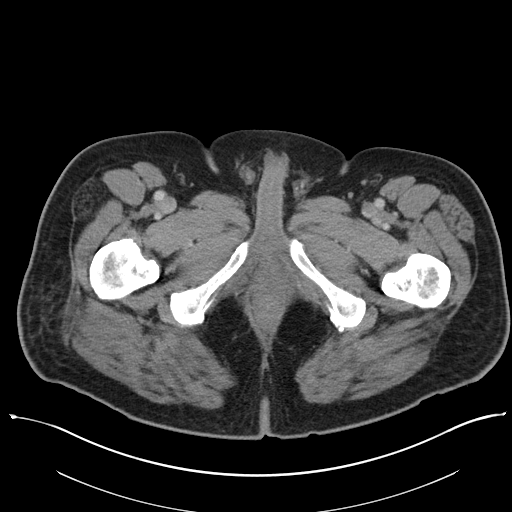
[im 53/150  soft-tissue]
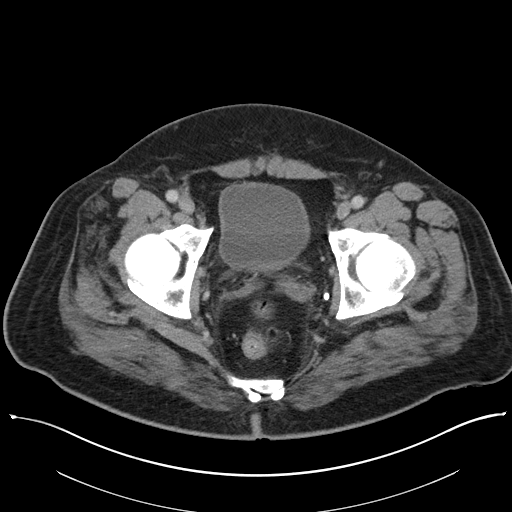
[im 60/150  soft-tissue]
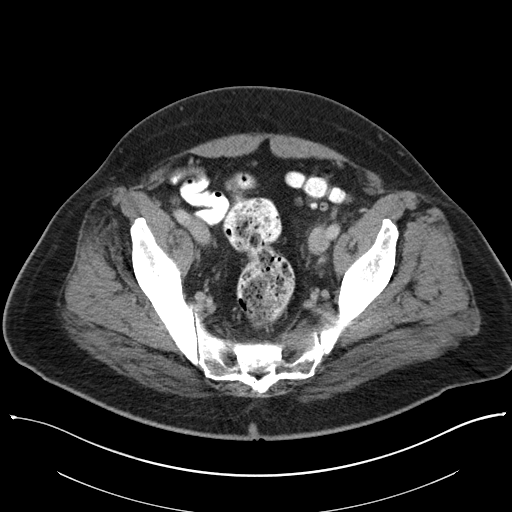
[im 75/150  soft-tissue]
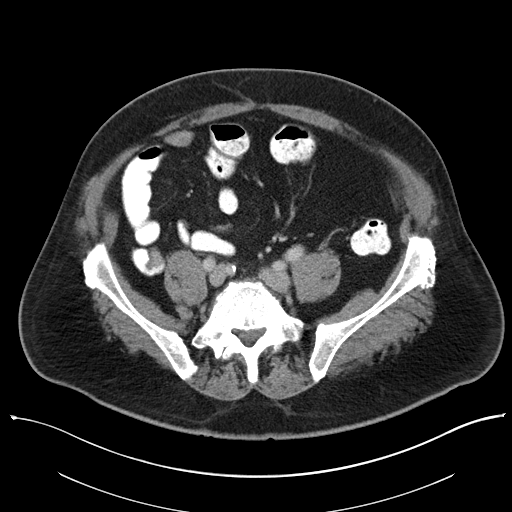
[im 90/150  soft-tissue]
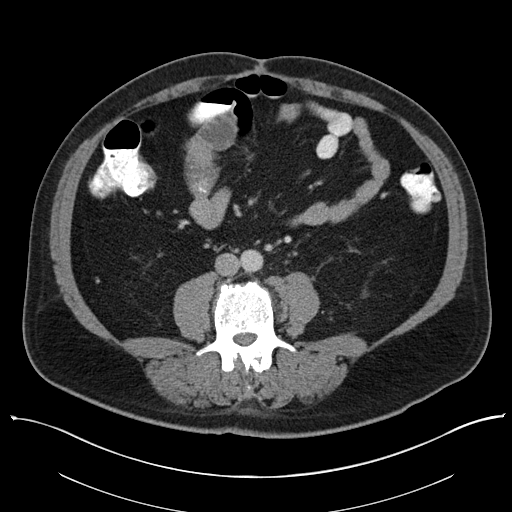
[im 97/150  soft-tissue]
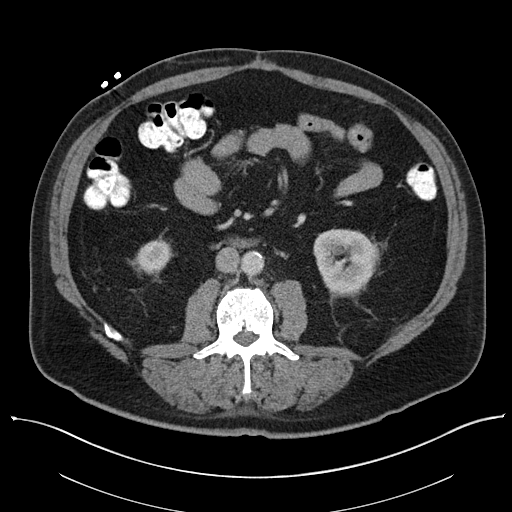
[im 112/150  soft-tissue]
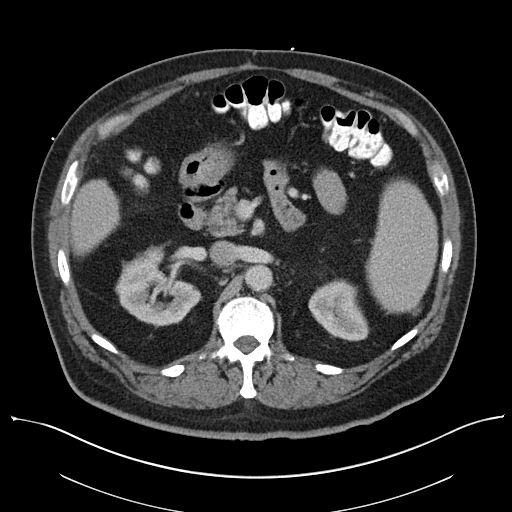
[im 112/150  bone]
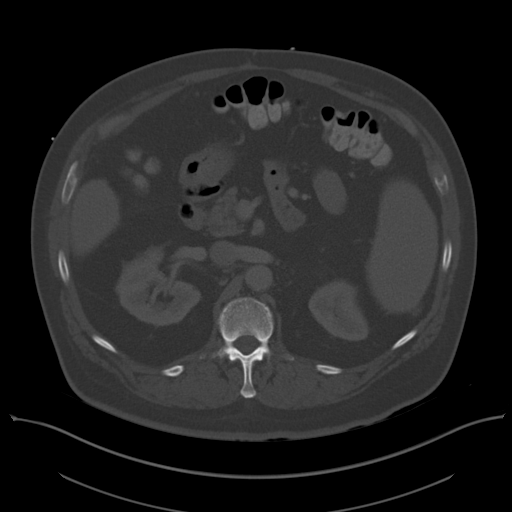
[im 127/150  soft-tissue]
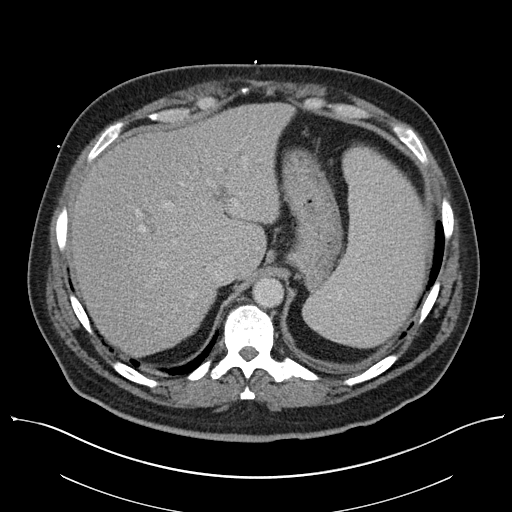
[im 142/150  soft-tissue]
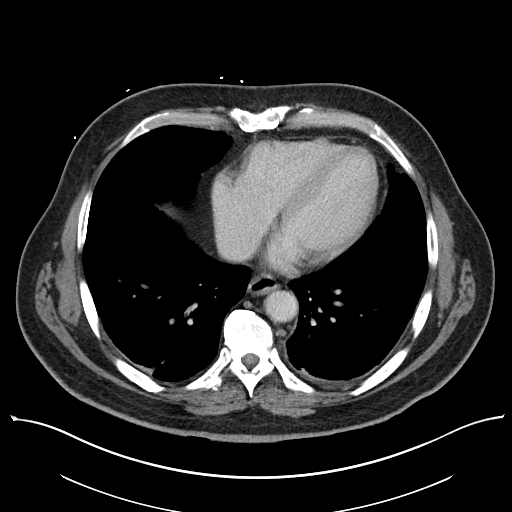

[Series 5: coronal soft tissue · coronal · 0.89mm/px · 3 of 108 slices shown]
[im 36/108  soft-tissue]
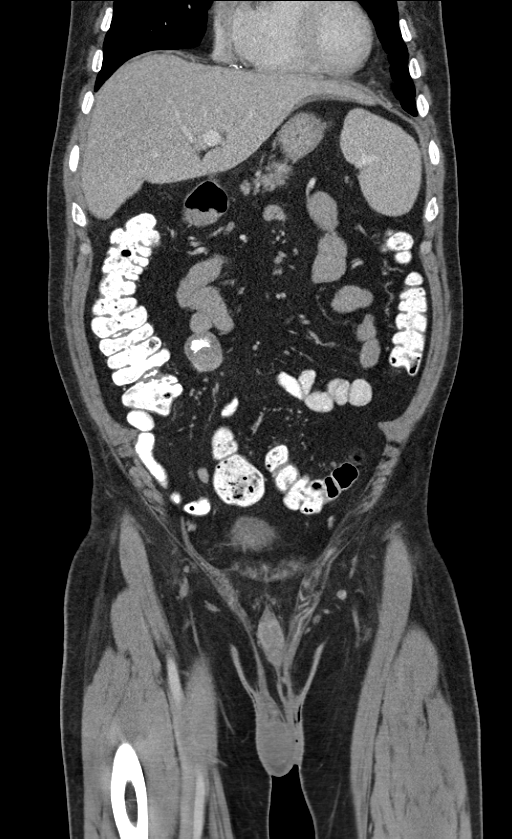
[im 48/108  soft-tissue]
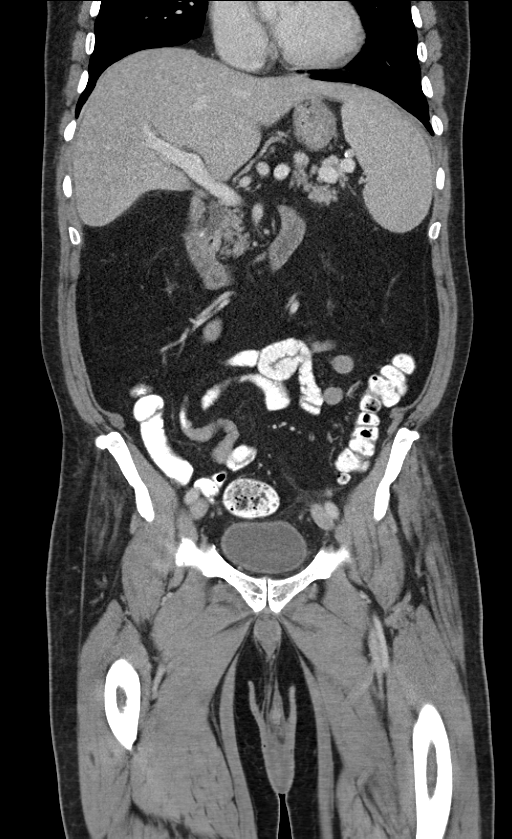
[im 60/108  soft-tissue]
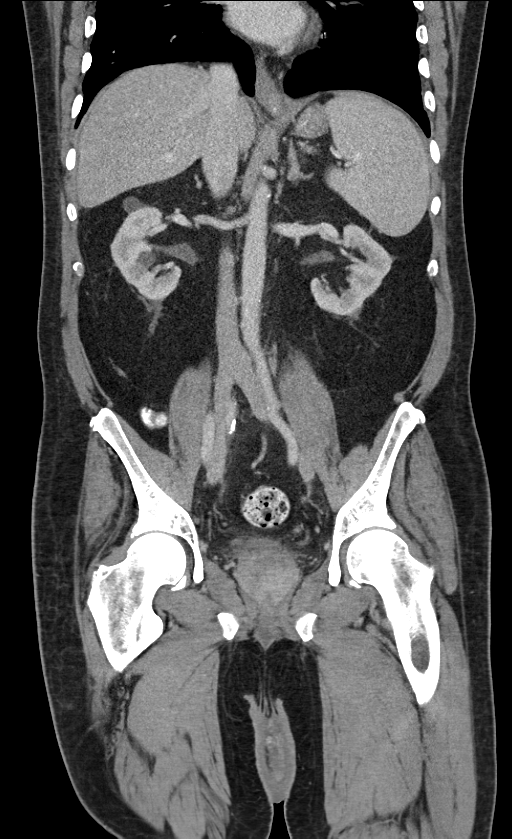

[14 of 46 positions shown; findings below may reference images not displayed]

FINDINGS: Minimal bibasilar linear atelectatic changes/ scarring. The
visualized lung bases are clear. The There is coronary vascular
calcification. No intraperitoneal free air or free fluid.

Cholecystectomy. Focal ill-defined hypodensity in the medial segment
of the left lobe of the liver adjacent to the gallbladder fossa most
compatible with focal fatty infiltration. The liver, pancreas, and
adrenal glands appear unremarkable. Splenomegaly measuring up to 19
cm. Small splenule. Mild bilateral renal atrophy. Multiple bilateral
renal hypodense lesions noted measuring up to 1.9 cm some of which
represent cysts and others are too small to characterize. There is
no hydronephrosis. The visualized ureters and urinary bladder appear
unremarkable. There is mild enlargement of the prostate gland
measuring 5.5 cm in transverse axial dimension. There is haziness of
the pelvic fat surrounding the prostate. There is thickening and
edema of the scrotal wall. Correlation with urinalysis and clinical
exam recommended to evaluate for urogenital infection. No drainable
fluid collection/abscess.

There is sigmoid diverticulosis without active inflammation. No
evidence of bowel obstruction. A 1.7 cm duodenal diverticulum noted
without active inflammation. The appendix is not visualized with
certainty. No inflammatory changes identified in the right lower
quadrant.

The abdominal aorta and IVC appear patent. The origins of the celiac
axis, SMA, IMA as well as the origins of the renal arteries are
patent. No portal venous gas identified. Small retroperitoneal and
iliac chain lymph nodes noted. There is no adenopathy. Midline
vertical anterior abdominal wall incisional scar. The abdominal wall
soft tissues appear unremarkable. There is degenerative changes of
the spine. No acute fracture. Median sternotomy wires noted.
IMPRESSION: Diffuse thickening of the scrotal wall with haziness of the fat
surrounding the prostate gland concerning for urogenital infection.
Clinical correlation is recommended. No drainable fluid
collection/abscess.

No hydronephrosis.

Diverticulosis.  No evidence of bowel obstruction or inflammation.

## 2016-06-25 DIAGNOSIS — J111 Influenza due to unidentified influenza virus with other respiratory manifestations: Secondary | ICD-10-CM | POA: Diagnosis not present

## 2017-03-11 DIAGNOSIS — K122 Cellulitis and abscess of mouth: Secondary | ICD-10-CM | POA: Diagnosis not present

## 2017-03-23 DIAGNOSIS — R52 Pain, unspecified: Secondary | ICD-10-CM | POA: Diagnosis not present

## 2017-03-23 DIAGNOSIS — R05 Cough: Secondary | ICD-10-CM | POA: Diagnosis not present

## 2017-03-23 DIAGNOSIS — R6889 Other general symptoms and signs: Secondary | ICD-10-CM | POA: Diagnosis not present

## 2017-03-31 DIAGNOSIS — J0100 Acute maxillary sinusitis, unspecified: Secondary | ICD-10-CM | POA: Diagnosis not present

## 2017-03-31 DIAGNOSIS — J189 Pneumonia, unspecified organism: Secondary | ICD-10-CM | POA: Diagnosis not present

## 2017-03-31 DIAGNOSIS — R1084 Generalized abdominal pain: Secondary | ICD-10-CM | POA: Diagnosis not present

## 2018-02-14 DIAGNOSIS — J209 Acute bronchitis, unspecified: Secondary | ICD-10-CM | POA: Diagnosis not present

## 2018-02-19 DIAGNOSIS — Z7982 Long term (current) use of aspirin: Secondary | ICD-10-CM | POA: Diagnosis not present

## 2018-02-19 DIAGNOSIS — I10 Essential (primary) hypertension: Secondary | ICD-10-CM | POA: Diagnosis not present

## 2018-02-19 DIAGNOSIS — R0789 Other chest pain: Secondary | ICD-10-CM | POA: Diagnosis not present

## 2018-02-19 DIAGNOSIS — Z9861 Coronary angioplasty status: Secondary | ICD-10-CM | POA: Diagnosis not present

## 2018-02-19 DIAGNOSIS — I200 Unstable angina: Secondary | ICD-10-CM | POA: Diagnosis not present

## 2018-02-19 DIAGNOSIS — Z79899 Other long term (current) drug therapy: Secondary | ICD-10-CM | POA: Diagnosis not present

## 2018-02-19 DIAGNOSIS — Z87891 Personal history of nicotine dependence: Secondary | ICD-10-CM | POA: Diagnosis not present

## 2018-02-19 DIAGNOSIS — I2510 Atherosclerotic heart disease of native coronary artery without angina pectoris: Secondary | ICD-10-CM | POA: Diagnosis not present

## 2018-02-19 DIAGNOSIS — R079 Chest pain, unspecified: Secondary | ICD-10-CM | POA: Diagnosis not present

## 2018-02-20 DIAGNOSIS — R079 Chest pain, unspecified: Secondary | ICD-10-CM | POA: Diagnosis not present

## 2018-08-15 DIAGNOSIS — J0100 Acute maxillary sinusitis, unspecified: Secondary | ICD-10-CM | POA: Diagnosis not present

## 2018-08-15 DIAGNOSIS — J029 Acute pharyngitis, unspecified: Secondary | ICD-10-CM | POA: Diagnosis not present

## 2018-12-06 DIAGNOSIS — J0100 Acute maxillary sinusitis, unspecified: Secondary | ICD-10-CM | POA: Diagnosis not present

## 2018-12-06 DIAGNOSIS — R509 Fever, unspecified: Secondary | ICD-10-CM | POA: Diagnosis not present

## 2020-01-11 DIAGNOSIS — M4800 Spinal stenosis, site unspecified: Secondary | ICD-10-CM | POA: Diagnosis not present

## 2020-01-11 DIAGNOSIS — J90 Pleural effusion, not elsewhere classified: Secondary | ICD-10-CM | POA: Diagnosis not present

## 2020-01-11 DIAGNOSIS — M25552 Pain in left hip: Secondary | ICD-10-CM | POA: Diagnosis not present

## 2020-01-11 DIAGNOSIS — R600 Localized edema: Secondary | ICD-10-CM | POA: Diagnosis not present

## 2020-01-11 DIAGNOSIS — M48061 Spinal stenosis, lumbar region without neurogenic claudication: Secondary | ICD-10-CM | POA: Diagnosis not present

## 2020-01-11 DIAGNOSIS — M5416 Radiculopathy, lumbar region: Secondary | ICD-10-CM | POA: Diagnosis not present

## 2020-01-11 DIAGNOSIS — S79921A Unspecified injury of right thigh, initial encounter: Secondary | ICD-10-CM | POA: Diagnosis not present

## 2020-01-11 DIAGNOSIS — S79911A Unspecified injury of right hip, initial encounter: Secondary | ICD-10-CM | POA: Diagnosis not present

## 2020-01-11 DIAGNOSIS — S79922A Unspecified injury of left thigh, initial encounter: Secondary | ICD-10-CM | POA: Diagnosis not present

## 2020-01-11 DIAGNOSIS — M5126 Other intervertebral disc displacement, lumbar region: Secondary | ICD-10-CM | POA: Diagnosis not present

## 2020-01-11 DIAGNOSIS — M47816 Spondylosis without myelopathy or radiculopathy, lumbar region: Secondary | ICD-10-CM | POA: Diagnosis not present

## 2020-01-11 DIAGNOSIS — S79912A Unspecified injury of left hip, initial encounter: Secondary | ICD-10-CM | POA: Diagnosis not present

## 2020-01-11 DIAGNOSIS — M1612 Unilateral primary osteoarthritis, left hip: Secondary | ICD-10-CM | POA: Diagnosis not present

## 2020-01-11 DIAGNOSIS — G8929 Other chronic pain: Secondary | ICD-10-CM | POA: Diagnosis not present

## 2020-01-11 DIAGNOSIS — M47817 Spondylosis without myelopathy or radiculopathy, lumbosacral region: Secondary | ICD-10-CM | POA: Diagnosis not present

## 2020-01-24 DIAGNOSIS — M48062 Spinal stenosis, lumbar region with neurogenic claudication: Secondary | ICD-10-CM | POA: Diagnosis not present

## 2020-02-04 DIAGNOSIS — R070 Pain in throat: Secondary | ICD-10-CM | POA: Diagnosis not present

## 2020-02-04 DIAGNOSIS — R05 Cough: Secondary | ICD-10-CM | POA: Diagnosis not present

## 2020-02-04 DIAGNOSIS — R0981 Nasal congestion: Secondary | ICD-10-CM | POA: Diagnosis not present

## 2020-02-04 DIAGNOSIS — J324 Chronic pansinusitis: Secondary | ICD-10-CM | POA: Diagnosis not present

## 2020-04-25 DIAGNOSIS — Z0279 Encounter for issue of other medical certificate: Secondary | ICD-10-CM | POA: Diagnosis not present

## 2021-02-22 DIAGNOSIS — N281 Cyst of kidney, acquired: Secondary | ICD-10-CM | POA: Diagnosis not present

## 2021-02-22 DIAGNOSIS — K6289 Other specified diseases of anus and rectum: Secondary | ICD-10-CM | POA: Diagnosis not present

## 2021-02-22 DIAGNOSIS — K6389 Other specified diseases of intestine: Secondary | ICD-10-CM | POA: Diagnosis not present

## 2021-02-22 DIAGNOSIS — K529 Noninfective gastroenteritis and colitis, unspecified: Secondary | ICD-10-CM | POA: Diagnosis not present

## 2021-02-22 DIAGNOSIS — K591 Functional diarrhea: Secondary | ICD-10-CM | POA: Diagnosis not present

## 2021-06-12 DIAGNOSIS — Z743 Need for continuous supervision: Secondary | ICD-10-CM | POA: Diagnosis not present

## 2021-06-12 DIAGNOSIS — M1990 Unspecified osteoarthritis, unspecified site: Secondary | ICD-10-CM | POA: Diagnosis not present

## 2021-06-12 DIAGNOSIS — I10 Essential (primary) hypertension: Secondary | ICD-10-CM | POA: Diagnosis not present

## 2021-06-12 DIAGNOSIS — Z7982 Long term (current) use of aspirin: Secondary | ICD-10-CM | POA: Diagnosis not present

## 2021-06-12 DIAGNOSIS — R079 Chest pain, unspecified: Secondary | ICD-10-CM | POA: Diagnosis not present

## 2021-06-12 DIAGNOSIS — Z7983 Long term (current) use of bisphosphonates: Secondary | ICD-10-CM | POA: Diagnosis not present

## 2021-06-12 DIAGNOSIS — R6889 Other general symptoms and signs: Secondary | ICD-10-CM | POA: Diagnosis not present

## 2021-06-12 DIAGNOSIS — R0789 Other chest pain: Secondary | ICD-10-CM | POA: Diagnosis not present

## 2021-06-12 DIAGNOSIS — Z79899 Other long term (current) drug therapy: Secondary | ICD-10-CM | POA: Diagnosis not present

## 2021-06-30 DIAGNOSIS — J029 Acute pharyngitis, unspecified: Secondary | ICD-10-CM | POA: Diagnosis not present

## 2021-06-30 DIAGNOSIS — M791 Myalgia, unspecified site: Secondary | ICD-10-CM | POA: Diagnosis not present

## 2021-06-30 DIAGNOSIS — R0981 Nasal congestion: Secondary | ICD-10-CM | POA: Diagnosis not present

## 2021-06-30 DIAGNOSIS — R051 Acute cough: Secondary | ICD-10-CM | POA: Diagnosis not present

## 2022-01-26 DIAGNOSIS — R2242 Localized swelling, mass and lump, left lower limb: Secondary | ICD-10-CM | POA: Diagnosis not present

## 2022-01-26 DIAGNOSIS — M79675 Pain in left toe(s): Secondary | ICD-10-CM | POA: Diagnosis not present

## 2022-01-26 DIAGNOSIS — M109 Gout, unspecified: Secondary | ICD-10-CM | POA: Diagnosis not present

## 2022-04-09 DIAGNOSIS — M109 Gout, unspecified: Secondary | ICD-10-CM | POA: Diagnosis not present

## 2022-04-26 DIAGNOSIS — M109 Gout, unspecified: Secondary | ICD-10-CM | POA: Diagnosis not present

## 2023-03-09 DIAGNOSIS — R0789 Other chest pain: Secondary | ICD-10-CM | POA: Diagnosis not present

## 2023-03-09 DIAGNOSIS — I10 Essential (primary) hypertension: Secondary | ICD-10-CM | POA: Diagnosis not present

## 2023-03-09 DIAGNOSIS — T63301A Toxic effect of unspecified spider venom, accidental (unintentional), initial encounter: Secondary | ICD-10-CM | POA: Diagnosis not present

## 2023-03-09 DIAGNOSIS — R0781 Pleurodynia: Secondary | ICD-10-CM | POA: Diagnosis not present

## 2023-03-09 DIAGNOSIS — E785 Hyperlipidemia, unspecified: Secondary | ICD-10-CM | POA: Diagnosis not present

## 2023-03-09 DIAGNOSIS — R001 Bradycardia, unspecified: Secondary | ICD-10-CM | POA: Diagnosis not present

## 2023-03-09 DIAGNOSIS — R079 Chest pain, unspecified: Secondary | ICD-10-CM | POA: Diagnosis not present

## 2023-03-09 DIAGNOSIS — R072 Precordial pain: Secondary | ICD-10-CM | POA: Diagnosis not present
